# Patient Record
Sex: Male | Born: 1980 | Race: Black or African American | Hispanic: No | Marital: Single | Smoking: Never smoker
Health system: Southern US, Community
[De-identification: ages and names within clinical notes are randomized; demographics above are authoritative.]

## PROBLEM LIST (undated history)

## (undated) DIAGNOSIS — K219 Gastro-esophageal reflux disease without esophagitis: Secondary | ICD-10-CM

## (undated) HISTORY — DX: Gastro-esophageal reflux disease without esophagitis: K21.9

## (undated) HISTORY — PX: GASTRIC BYPASS: SHX52

---

## 2016-01-07 ENCOUNTER — Ambulatory Visit (HOSPITAL_COMMUNITY)
Admission: RE | Admit: 2016-01-07 | Discharge: 2016-01-07 | Disposition: A | Discharge status: Home / Self Care | Payer: No Typology Code available for payment source | Source: Ambulatory Visit | Attending: Chiropractic Medicine | Admitting: Chiropractic Medicine

## 2016-01-07 ENCOUNTER — Other Ambulatory Visit (HOSPITAL_COMMUNITY): Payer: Self-pay | Admitting: Chiropractic Medicine

## 2016-01-07 DIAGNOSIS — M542 Cervicalgia: Secondary | ICD-10-CM | POA: Insufficient documentation

## 2016-01-07 DIAGNOSIS — R52 Pain, unspecified: Secondary | ICD-10-CM

## 2016-01-07 DIAGNOSIS — M549 Dorsalgia, unspecified: Secondary | ICD-10-CM | POA: Diagnosis present
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
## 2018-03-15 ENCOUNTER — Telehealth: Payer: Self-pay

## 2018-03-15 NOTE — Telephone Encounter (Signed)
Copied from CRM [phone]. Topic: Appointment Scheduling - New Patient >> Mar 15, 2018 11:30 AM Angela NevinWilliams, Candice N wrote: New patient has been scheduled for your office. Provider: Dr. Salomon FickBanks  Date of Appointment: 01/08  Route to department's PEC pool.

## 2018-03-28 ENCOUNTER — Ambulatory Visit (INDEPENDENT_AMBULATORY_CARE_PROVIDER_SITE_OTHER): Payer: No Typology Code available for payment source | Admitting: Family Medicine

## 2018-03-28 ENCOUNTER — Encounter: Payer: Self-pay | Admitting: Family Medicine

## 2018-03-28 VITALS — BP 126/84 | HR 100 | Temp 98.5°F | Ht 72.0 in | Wt 296.0 lb

## 2018-03-28 DIAGNOSIS — I493 Ventricular premature depolarization: Secondary | ICD-10-CM

## 2018-03-28 DIAGNOSIS — R0683 Snoring: Secondary | ICD-10-CM

## 2018-03-28 DIAGNOSIS — R5383 Other fatigue: Secondary | ICD-10-CM

## 2018-03-28 DIAGNOSIS — Z7689 Persons encountering health services in other specified circumstances: Secondary | ICD-10-CM

## 2018-03-28 NOTE — Progress Notes (Signed)
Patient presents to clinic today to follow up on ongoing concern and establish care.  Pt is accompanied by a male friend.  SUBJECTIVE: PMH: Pt is a 38 yo male with pmh sig for GERD.  Pt has not been seen by a regular provider.    Pt notes recent trip to [facility] (12/26) for PVCs.  Pt had chest discomfort at work that continued to the next day prompting him to seek care.  Pt states EKG with a few PVCs and labs were normal.  Pt denies anxiety and drinking caffeine.  Pt endorses concerns about having a heart condition.  Pt's mom and dad had several MIs.     "feeling off": -pt notes feeling tired/sluggish for "a while" -also having increased heart burn -expresses concerns about overall health -trying to drink more water and exercise -has stopped eating fast food and is eating more baked items.  Snoring: -pt notes being told he snores -pt's friend endorses apneic spells at night and frequent awakening -pt notes fatigue during the day.  -denies daytime somnolence or need for naps. -has never had a sleep study.  Allergies: NKDA  Social hx: Pt is divorced and currently single.  Pt works as a Lawyer.  Pt endorses social EtOH use.  Pt denies tobacco and drug use.  Health Maintenance: [facility] -- [facility] [address] Immunizations --TB test 2019  FMHx: Mom- MI, drug abuse, alcohol abuse, heart disease Dad, desc, Alcohol abuse, drug abuse, MI x3 (died of MI at 67), heart disease MGM-throat cancer, hearing loss, MI MGF-cancer, hearing loss, MI PGM-ovaria cancer, MI PGF-prostate cancer, hearing loss, MI History reviewed. No pertinent past medical history.  History reviewed. No pertinent surgical history.  No current outpatient medications on file prior to visit.   No current facility-administered medications on file prior to visit.     No Known Allergies  Family History  Problem Relation Age of Onset  . Heart attack Mother   . Heart attack Father   . Heart  attack Maternal Grandmother   . Cancer Maternal Grandmother   . Heart attack Maternal Grandfather   . Cancer Maternal Grandfather   . Heart attack Paternal Grandmother   . Cancer Paternal Grandmother   . Heart attack Paternal Grandfather   . Cancer Paternal Grandfather     Social History   Socioeconomic History  . Marital status: Single    Spouse name: Not on file  . Number of children: Not on file  . Years of education: Not on file  . Highest education level: Not on file  Occupational History  . Not on file  Social Needs  . Financial resource strain: Not on file  . Food insecurity:    Worry: Not on file    Inability: Not on file  . Transportation needs:    Medical: Not on file    Non-medical: Not on file  Tobacco Use  . Smoking status: Never Smoker  . Smokeless tobacco: Never Used  Substance and Sexual Activity  . Alcohol use: Yes    Comment: OCCASIONAL  . Drug use: Never  . Sexual activity: Not on file  Lifestyle  . Physical activity:    Days per week: Not on file    Minutes per session: Not on file  . Stress: Not on file  Relationships  . Social connections:    Talks on phone: Not on file    Gets together: Not on file    Attends religious service: Not  on file    Active member of club or organization: Not on file    Attends meetings of clubs or organizations: Not on file    Relationship status: Not on file  . Intimate partner violence:    Fear of current or ex partner: Not on file    Emotionally abused: Not on file    Physically abused: Not on file    Forced sexual activity: Not on file  Other Topics Concern  . Not on file  Social History Narrative  . Not on file    ROS General: Denies fever, chills, night sweats, changes in weight, changes in appetite +fatigue HEENT: Denies headaches, ear pain, changes in vision, rhinorrhea, sore throat CV: Denies CP, palpitations, SOB, orthopnea  +h/o PVCs Pulm: Denies SOB, cough, wheezing  +snoring GI: Denies  abdominal pain, nausea, vomiting, diarrhea, constipation GU: Denies dysuria, hematuria, frequency, vaginal discharge Msk: Denies muscle cramps, joint pains Neuro: Denies weakness, numbness, tingling Skin: Denies rashes, bruising Psych: Denies depression, anxiety, hallucinations   BP 126/84 (BP Location: Left Arm, Patient Position: Sitting, Cuff Size: Large)   Pulse 100   Temp 98.5 F (36.9 C) (Oral)   Ht 6' (1.829 m)   Wt 296 lb (134.3 kg)   SpO2 96%   BMI 40.14 kg/m   Physical Exam Gen. Pleasant, well developed, well-nourished, in NAD HEENT - [place]/AT, PERRL, no scleral icterus, no nasal drainage, pharynx without erythema or exudate. Neck: No JVD, no thyromegaly, no carotid bruits Lungs: no use of accessory muscles, CTAB, no wheezes, rales or rhonchi Cardiovascular: RRR, No r/g/m, no peripheral edema Abdomen: BS present, soft, nontender,nondistended Neuro:  A&Ox3, CN II-XII intact, normal gait Skin:  Warm, dry, intact, no lesions  No results found for this or any previous visit (from the past 2160 hour(s)).  Assessment/Plan: Fatigue, unspecified type -discussed various causes including thyroid dysfunction, cardiac causes, Vit D def, OSA -will obtain labs when pt returns in the next few wks.  PVC's (premature ventricular contractions) -RRR on exam -discussed obtaining recrods from UC visit -discussed limiting caffeine and EtOH intake. -consider holter monitor -given handout -pt to [facility] in a few wks for CPE and labs (inculding TSH, free T4)  Snoring  -concern for OSA. -likely contributing to fatigue -will place referral for sleep study - Plan: Ambulatory referral to Pulmonology  Encounter to establish care  -We reviewed the PMH, PSH, FH, SH, Meds and Allergies. -We provided refills for any medications we will prescribe as needed. -We addressed current concerns per orders and patient instructions. -We have asked for records for pertinent exams, studies, vaccines and  notes from previous providers. -We have advised patient to follow up per instructions below.  F/u in the next few wks for CPE  Abbe Amsterdam, MD

## 2018-03-28 NOTE — Patient Instructions (Signed)
Premature Ventricular Contraction  A premature ventricular contraction (PVC) is a common kind of irregular heartbeat (arrhythmia). These contractions are extra heartbeats that start in the ventricles of the heart and occur too early in the normal sequence. During the PVC, the heart's normal electrical pathway is not used, so the beat is shorter and less effective. In most cases, these contractions come and go and do not require treatment. What are the causes? Common causes of the condition include:  Smoking.  Drinking alcohol.  Certain medicines.  Some illegal drugs.  Stress.  Caffeine. Certain medical conditions can also cause PVCs:  Heart failure.  Heart attack, or coronary artery disease.  Heart valve problems.  Changes in minerals in the blood (electrolytes).  Low blood oxygen levels or high carbon dioxide levels. In many cases, the cause of this condition is not known. What are the signs or symptoms? The main symptom of this condition is fast or skipped heartbeats (palpitations). Other symptoms include:  Chest pain.  Shortness of breath.  Feeling tired.  Dizziness.  Difficulty exercising. In some cases, there are no symptoms. How is this diagnosed? This condition may be diagnosed based on:  Your medical history.  A physical exam. During the exam, the health care provider will check for irregular heartbeats.  Tests, such as: ? An ECG (electrocardiogram) to monitor the electrical activity of your heart. ? Ambulatory cardiac monitor. This device records your heartbeats for 24 hours or more. ? Stress tests to see how exercise affects your heart rhythm and blood supply. ? Echocardiogram. This test uses sound waves (ultrasound) to produce an image of your heart. ? Electrophysiology study (EPS). This test checks for electrical problems in your heart. How is this treated? Treatment for this condition depends on any underlying conditions, the type of PVCs that you  are having, and how much the symptoms are interfering with your daily life. Possible treatments include:  Avoiding things that cause premature contractions (triggers). These include caffeine and alcohol.  Taking medicines if symptoms are severe or if the extra heartbeats are frequent.  Getting treatment for underlying conditions that cause PVCs.  Having an implantable cardioverter defibrillator (ICD), if you are at risk for a serious arrhythmia. The ICD is a small device that is inserted into your chest to monitor your heartbeat. When it senses an irregular heartbeat, it sends a shock to bring the heartbeat back to normal.  Having a procedure to destroy the portion of the heart tissue that sends out abnormal signals (catheter ablation). In some cases, no treatment is required. Follow these instructions at home: Alcohol use   Do not drink alcohol if: ? Your health care provider tells you not to drink. ? You are pregnant, may be pregnant, or are planning to become pregnant. ? Alcohol triggers your episodes.  If you drink alcohol, limit how much you have. You may drink: ? 0-1 drink a day for women. ? 0-2 drinks a day for men.  Be aware of how much alcohol is in your drink. In the [place] one drink equals one typical bottle of beer (12 oz), one-half glass of wine (5 oz), or one shot of hard liquor (1 oz). General instructions  Do not use any products that contain nicotine or tobacco, such as cigarettes and e-cigarettes. If you need help quitting, ask your health care provider.  Find healthy ways to manage stress. Avoid stressful situations when possible.  Exercise regularly. Ask your health care provider what type of exercise is safe  for you.  Try to get at least 7-9 hours of sleep each night, or as much as recommended by your health care provider.  If caffeine triggers episodes of PVC, do not eat, drink, or use anything with caffeine in it.  Do not use illegal drugs.  Take  over-the-counter and prescription medicines only as told by your health care provider.  Keep all follow-up visits as told by your health care provider. This is important. Contact a health care provider if you:  Feel palpitations. Get help right away if you:  Have chest pain.  Have shortness of breath.  Have sweating for no reason.  Have nausea and vomiting.  Become light-headed or you faint. Summary  A premature ventricular contraction (PVC) is a common kind of irregular heartbeat (arrhythmia).  In most cases, these contractions come and go and do not require treatment.  You may need to wear an ambulatory cardiac monitor. This records your heartbeats for 24 hours or more.  Treatment depends on any underlying conditions, the type of PVCs that you are having, and how much the symptoms are interfering with your daily life. This information is not intended to replace advice given to you by your health care provider. Make sure you discuss any questions you have with your health care provider. Document Released: 10/23/2003 Document Revised: 10/20/2017 Document Reviewed: 04/25/2017 Elsevier Interactive Patient Education  2019 [employer].  Fatigue If you have fatigue, you feel tired all the time and have a lack of energy or a lack of motivation. Fatigue may make it difficult to start or complete tasks because of exhaustion. In general, occasional or mild fatigue is often a normal response to activity or life. However, long-lasting (chronic) or extreme fatigue may be a symptom of a medical condition. Follow these instructions at home: General instructions  Watch your fatigue for any changes.  Go to bed and get up at the same time every day.  Avoid fatigue by pacing yourself during the day and getting enough sleep at night.  Maintain a healthy weight. Medicines  Take over-the-counter and prescription medicines only as told by your health care provider.  Take a multivitamin, if  told by your health care provider.  Do not use herbal or dietary supplements unless they are approved by your health care provider. Activity   Exercise regularly, as told by your health care provider.  Use or practice techniques to help you relax, such as yoga, tai chi, meditation, or massage therapy. Eating and drinking   Avoid heavy meals in the evening.  Eat a well-balanced diet, which includes lean proteins, whole grains, plenty of fruits and vegetables, and low-fat dairy products.  Avoid consuming too much caffeine.  Avoid the use of alcohol.  Drink enough fluid to keep your urine pale yellow. Lifestyle  Change situations that cause you stress. Try to keep your work and personal schedule in balance.  Do not use any products that contain nicotine or tobacco, such as cigarettes and e-cigarettes. If you need help quitting, ask your health care provider.  Do not use drugs. Contact a health care provider if:  Your fatigue does not get better.  You have a fever.  You suddenly lose or gain weight.  You have headaches.  You have trouble falling asleep or sleeping through the night.  You feel angry, guilty, anxious, or sad.  You are unable to have a bowel movement (constipation).  Your skin is dry.  You have swelling in your legs or another  part of your body. Get help right away if:  You feel confused.  Your vision is blurry.  You feel faint or you pass out.  You have a severe headache.  You have severe pain in your abdomen, your back, or the area between your waist and hips (pelvis).  You have chest pain, shortness of breath, or an irregular or fast heartbeat.  You are unable to urinate, or you urinate less than normal.  You have abnormal bleeding, such as bleeding from the rectum, vagina, nose, lungs, or nipples.  You vomit blood.  You have thoughts about hurting yourself or others. If you ever feel like you may hurt yourself or others, or have  thoughts about taking your own life, get help right away. You can go to your nearest emergency department or call:  Your local emergency services (911 in the [place]  A suicide crisis helpline, such as the [place] at 719-501-0557. This is open 24 hours a day. Summary  If you have fatigue, you feel tired all the time and have a lack of energy or a lack of motivation.  Fatigue may make it difficult to start or complete tasks because of exhaustion.  Long-lasting (chronic) or extreme fatigue may be a symptom of a medical condition.  Exercise regularly, as told by your health care provider.  Change situations that cause you stress. Try to keep your work and personal schedule in balance. This information is not intended to replace advice given to you by your health care provider. Make sure you discuss any questions you have with your health care provider. Document Released: 01/02/2007 Document Revised: 11/30/2016 Document Reviewed: 11/30/2016 Elsevier Interactive Patient Education  [employer].

## 2018-03-29 ENCOUNTER — Encounter: Payer: Self-pay | Admitting: Family Medicine

## 2018-04-11 ENCOUNTER — Ambulatory Visit (INDEPENDENT_AMBULATORY_CARE_PROVIDER_SITE_OTHER): Payer: No Typology Code available for payment source | Admitting: Family Medicine

## 2018-04-11 ENCOUNTER — Encounter: Payer: No Typology Code available for payment source | Admitting: Family Medicine

## 2018-04-11 ENCOUNTER — Encounter: Payer: Self-pay | Admitting: Family Medicine

## 2018-04-11 VITALS — BP 120/80 | HR 86 | Temp 98.0°F | Wt 293.0 lb

## 2018-04-11 DIAGNOSIS — Z0000 Encounter for general adult medical examination without abnormal findings: Secondary | ICD-10-CM | POA: Diagnosis not present

## 2018-04-11 DIAGNOSIS — Z131 Encounter for screening for diabetes mellitus: Secondary | ICD-10-CM

## 2018-04-11 DIAGNOSIS — R5383 Other fatigue: Secondary | ICD-10-CM | POA: Diagnosis not present

## 2018-04-11 DIAGNOSIS — Z13220 Encounter for screening for lipoid disorders: Secondary | ICD-10-CM | POA: Diagnosis not present

## 2018-04-11 DIAGNOSIS — R0982 Postnasal drip: Secondary | ICD-10-CM

## 2018-04-11 DIAGNOSIS — I493 Ventricular premature depolarization: Secondary | ICD-10-CM | POA: Diagnosis not present

## 2018-04-11 LAB — CBC WITH DIFFERENTIAL/PLATELET
Basophils Absolute: 0 10*3/uL (ref 0.0–0.1)
Basophils Relative: 0.3 % (ref 0.0–3.0)
EOS ABS: 0.1 10*3/uL (ref 0.0–0.7)
EOS PCT: 1.2 % (ref 0.0–5.0)
HCT: 44.5 % (ref 39.0–52.0)
Hemoglobin: 14.8 g/dL (ref 13.0–17.0)
LYMPHS ABS: 2.7 10*3/uL (ref 0.7–4.0)
Lymphocytes Relative: 29.4 % (ref 12.0–46.0)
MCHC: 33.3 g/dL (ref 30.0–36.0)
MCV: 76.9 fl — ABNORMAL LOW (ref 78.0–100.0)
MONO ABS: 0.5 10*3/uL (ref 0.1–1.0)
Monocytes Relative: 5.3 % (ref 3.0–12.0)
Neutro Abs: 5.8 10*3/uL (ref 1.4–7.7)
Neutrophils Relative %: 63.8 % (ref 43.0–77.0)
Platelets: 331 10*3/uL (ref 150.0–400.0)
RBC: 5.79 Mil/uL (ref 4.22–5.81)
RDW: 14 % (ref 11.5–15.5)
WBC: 9.1 10*3/uL (ref 4.0–10.5)

## 2018-04-11 LAB — TSH: TSH: 2.25 u[IU]/mL (ref 0.35–4.50)

## 2018-04-11 LAB — T4, FREE: Free T4: 0.77 ng/dL (ref 0.60–1.60)

## 2018-04-11 LAB — BASIC METABOLIC PANEL
BUN: 12 mg/dL (ref 6–23)
CO2: 28 mEq/L (ref 19–32)
Calcium: 10.2 mg/dL (ref 8.4–10.5)
Chloride: 102 mEq/L (ref 96–112)
Creatinine, Ser: 0.9 mg/dL (ref 0.40–1.50)
GFR: 114.94 mL/min (ref >60.00)
Glucose, Bld: 77 mg/dL (ref 70–99)
Potassium: 4.2 mEq/L (ref 3.5–5.1)
Sodium: 140 mEq/L (ref 135–145)

## 2018-04-11 LAB — LIPID PANEL
CHOLESTEROL: 211 mg/dL — AB (ref 0–200)
HDL: 34 mg/dL — ABNORMAL LOW (ref >39.00)
LDL Cholesterol: 143 mg/dL — ABNORMAL HIGH (ref 0–99)
NonHDL: 177.4
Total CHOL/HDL Ratio: 6
Triglycerides: 171 mg/dL — ABNORMAL HIGH (ref 0.0–149.0)
VLDL: 34.2 mg/dL (ref 0.0–40.0)

## 2018-04-11 LAB — HEMOGLOBIN A1C: Hgb A1c MFr Bld: 5.7 % (ref 4.6–6.5)

## 2018-04-11 LAB — VITAMIN D 25 HYDROXY (VIT D DEFICIENCY, FRACTURES): VITD: 14.96 ng/mL — ABNORMAL LOW (ref 30.00–100.00)

## 2018-04-11 MED ORDER — CETIRIZINE HCL 10 MG PO TABS: 10.0000 mg | ORAL_TABLET | Freq: Every day | ORAL | 2 refills | Status: DC

## 2018-04-11 NOTE — Patient Instructions (Signed)
Preventive Care 18-39 Years, Male Preventive care refers to lifestyle choices and visits with your health care provider that can promote health and wellness. What does preventive care include?   A yearly physical exam. This is also called an annual well check.  Dental exams once or twice a year.  Routine eye exams. Ask your health care provider how often you should have your eyes checked.  Personal lifestyle choices, including: ? Daily care of your teeth and gums. ? Regular physical activity. ? Eating a healthy diet. ? Avoiding tobacco and drug use. ? Limiting alcohol use. ? Practicing safe sex. What happens during an annual well check? The services and screenings done by your health care provider during your annual well check will depend on your age, overall health, lifestyle risk factors, and family history of disease. Counseling Your health care provider may ask you questions about your:  Alcohol use.  Tobacco use.  Drug use.  Emotional well-being.  Home and relationship well-being.  Sexual activity.  Eating habits.  Work and work Statistician. Screening You may have the following tests or measurements:  Height, weight, and BMI.  Blood pressure.  Lipid and cholesterol levels. These may be checked every 5 years starting at age 44.  Diabetes screening. This is done by checking your blood sugar (glucose) after you have not eaten for a while (fasting).  Skin check.  Hepatitis C blood test.  Hepatitis B blood test.  Sexually transmitted disease (STD) testing. Discuss your test results, treatment options, and if necessary, the need for more tests with your health care provider. Vaccines Your health care provider may recommend certain vaccines, such as:  Influenza vaccine. This is recommended every year.  Tetanus, diphtheria, and acellular pertussis (Tdap, Td) vaccine. You may need a Td booster every 10 years.  Varicella vaccine. You may need this if you  have not been vaccinated.  HPV vaccine. If you are 1 or younger, you may need three doses over 6 months.  Measles, mumps, and rubella (MMR) vaccine. You may need at least one dose of MMR.You may also need a second dose.  Pneumococcal 13-valent conjugate (PCV13) vaccine. You may need this if you have certain conditions and have not been vaccinated.  Pneumococcal polysaccharide (PPSV23) vaccine. You may need one or two doses if you smoke cigarettes or if you have certain conditions.  Meningococcal vaccine. One dose is recommended if you are age 39-21 years and a first-year college student living in a residence hall, or if you have one of several medical conditions. You may also need additional booster doses.  Hepatitis A vaccine. You may need this if you have certain conditions or if you travel or work in places where you may be exposed to hepatitis A.  Hepatitis B vaccine. You may need this if you have certain conditions or if you travel or work in places where you may be exposed to hepatitis B.  Haemophilus influenzae type b (Hib) vaccine. You may need this if you have certain risk factors. Talk to your health care provider about which screenings and vaccines you need and how often you need them. This information is not intended to replace advice given to you by your health care provider. Make sure you discuss any questions you have with your health care provider. Document Released: 05/03/2001 Document Revised: 10/18/2016 Document Reviewed: 01/06/2015 Elsevier Interactive Patient Education  2019 [employer].  Fatigue If you have fatigue, you feel tired all the time and have a lack of  energy or a lack of motivation. Fatigue may make it difficult to start or complete tasks because of exhaustion. In general, occasional or mild fatigue is often a normal response to activity or life. However, long-lasting (chronic) or extreme fatigue may be a symptom of a medical condition. Follow these  instructions at home: General instructions  Watch your fatigue for any changes.  Go to bed and get up at the same time every day.  Avoid fatigue by pacing yourself during the day and getting enough sleep at night.  Maintain a healthy weight. Medicines  Take over-the-counter and prescription medicines only as told by your health care provider.  Take a multivitamin, if told by your health care provider.  Do not use herbal or dietary supplements unless they are approved by your health care provider. Activity   Exercise regularly, as told by your health care provider.  Use or practice techniques to help you relax, such as yoga, tai chi, meditation, or massage therapy. Eating and drinking   Avoid heavy meals in the evening.  Eat a well-balanced diet, which includes lean proteins, whole grains, plenty of fruits and vegetables, and low-fat dairy products.  Avoid consuming too much caffeine.  Avoid the use of alcohol.  Drink enough fluid to keep your urine pale yellow. Lifestyle  Change situations that cause you stress. Try to keep your work and personal schedule in balance.  Do not use any products that contain nicotine or tobacco, such as cigarettes and e-cigarettes. If you need help quitting, ask your health care provider.  Do not use drugs. Contact a health care provider if:  Your fatigue does not get better.  You have a fever.  You suddenly lose or gain weight.  You have headaches.  You have trouble falling asleep or sleeping through the night.  You feel angry, guilty, anxious, or sad.  You are unable to have a bowel movement (constipation).  Your skin is dry.  You have swelling in your legs or another part of your body. Get help right away if:  You feel confused.  Your vision is blurry.  You feel faint or you pass out.  You have a severe headache.  You have severe pain in your abdomen, your back, or the area between your waist and hips  (pelvis).  You have chest pain, shortness of breath, or an irregular or fast heartbeat.  You are unable to urinate, or you urinate less than normal.  You have abnormal bleeding, such as bleeding from the rectum, vagina, nose, lungs, or nipples.  You vomit blood.  You have thoughts about hurting yourself or others. If you ever feel like you may hurt yourself or others, or have thoughts about taking your own life, get help right away. You can go to your nearest emergency department or call:  Your local emergency services (911 in the [place]  A suicide crisis helpline, such as the [facility] at (253) 025-1869. This is open 24 hours a day. Summary  If you have fatigue, you feel tired all the time and have a lack of energy or a lack of motivation.  Fatigue may make it difficult to start or complete tasks because of exhaustion.  Long-lasting (chronic) or extreme fatigue may be a symptom of a medical condition.  Exercise regularly, as told by your health care provider.  Change situations that cause you stress. Try to keep your work and personal schedule in balance. This information is not intended to replace advice given  to you by your health care provider. Make sure you discuss any questions you have with your health care provider. Document Released: 01/02/2007 Document Revised: 11/30/2016 Document Reviewed: 11/30/2016 Elsevier Interactive Patient Education  [employer].

## 2018-04-11 NOTE — Progress Notes (Signed)
Subjective:     Dillon Rueb. is a 38 y.o. male and is here for a comprehensive physical exam. The patient reports problems - sinus issues/drainage.  Pt has not tried anything for his symptoms.  In the past he was given nasal spray pills.  Pt fasting for labs.  Also getting labs for fatigue and PVCs as discussed at last OFV on 03/28/18.  Pt trying to drink more water daily.  Pt has yet to hear back about the sleep study.  Referral was placed at last OFV.  Social History   Socioeconomic History  . Marital status: Single    Spouse name: Not on file  . Number of children: Not on file  . Years of education: Not on file  . Highest education level: Not on file  Occupational History  . Not on file  Social Needs  . Financial resource strain: Not on file  . Food insecurity:    Worry: Not on file    Inability: Not on file  . Transportation needs:    Medical: Not on file    Non-medical: Not on file  Tobacco Use  . Smoking status: Never Smoker  . Smokeless tobacco: Never Used  Substance and Sexual Activity  . Alcohol use: Yes    Comment: OCCASIONAL  . Drug use: Never  . Sexual activity: Yes  Lifestyle  . Physical activity:    Days per week: Not on file    Minutes per session: Not on file  . Stress: Not on file  Relationships  . Social connections:    Talks on phone: Not on file    Gets together: Not on file    Attends religious service: Not on file    Active member of club or organization: Not on file    Attends meetings of clubs or organizations: Not on file    Relationship status: Not on file  . Intimate partner violence:    Fear of current or ex partner: Not on file    Emotionally abused: Not on file    Physically abused: Not on file    Forced sexual activity: Not on file  Other Topics Concern  . Not on file  Social History Narrative  . Not on file   Health Maintenance  Topic Date Due  . HIV Screening  05/11/1996  . TETANUS/TDAP  05/11/2000  . INFLUENZA VACCINE   12/20/2018 (Originally 10/19/2017)    The following portions of the patient's history were reviewed and updated as appropriate: allergies, current medications, past family history, past medical history, past social history, past surgical history and problem list.  Review of Systems Pertinent items noted in HPI and remainder of comprehensive ROS otherwise negative.   Objective:    BP 120/80 (BP Location: Left Arm, Patient Position: Sitting, Cuff Size: Large)   Pulse 86   Temp 98 F (36.7 C) (Oral)   Wt 293 lb (132.9 kg)   SpO2 98%   BMI 39.74 kg/m  General appearance: alert, cooperative and no distress Head: Normocephalic, without obvious abnormality, atraumatic Eyes: conjunctivae/corneas clear. PERRL, EOM's intact. Fundi benign. Ears: TM's full b/l and external ear canals both ears Nose: Nares normal. Septum midline. Mucosa normal. No drainage or sinus tenderness. Throat: lips, mucosa, and tongue normal; teeth and gums normal, pharynx with postnasal drainage Neck: no adenopathy, no carotid bruit, no JVD, supple, symmetrical, trachea midline and thyroid not enlarged, symmetric, no tenderness/mass/nodules Lungs: clear to auscultation bilaterally Heart: regular rate and rhythm, S1, S2 normal,  no murmur, click, rub or gallop Abdomen: soft, non-tender; bowel sounds normal; no masses,  no organomegaly Extremities: extremities normal, atraumatic, no cyanosis or edema Skin: Skin color, texture, turgor normal. No rashes or lesions Neurologic: Alert and oriented X 3, normal strength and tone. Normal symmetric reflexes. Normal coordination and gait    Assessment:    Healthy male exam.      Plan:     Anticipatory guidance given including wearing seatbelts, smoke detectors in the home, increasing physical activity, increasing p.o. intake of water and vegetables. -will obtain lab as discussed during previous OFV 03/28/18 -handout given -next CPE in 1 yr See After Visit Summary for Counseling  Recommendations    Fatigue -TSH, free T4, Vitamin D, CBC -given handout  PVCs -TSH, Free T4, CBC  Post nasal drip -discussed restarting allergy nasal spray -Cetirizine 10 mg daily.  F/u prn  Dillon AmsterdamShannon Genene Kilman, MD

## 2018-04-18 ENCOUNTER — Other Ambulatory Visit: Payer: Self-pay | Admitting: Family Medicine

## 2018-04-18 MED ORDER — VITAMIN D (ERGOCALCIFEROL) 1.25 MG (50000 UNIT) PO CAPS: 50000.0000 [IU] | ORAL_CAPSULE | ORAL | 0 refills | Status: DC

## 2018-05-23 ENCOUNTER — Ambulatory Visit (INDEPENDENT_AMBULATORY_CARE_PROVIDER_SITE_OTHER): Payer: No Typology Code available for payment source | Admitting: Pulmonary Disease

## 2018-05-23 ENCOUNTER — Encounter: Payer: Self-pay | Admitting: Pulmonary Disease

## 2018-05-23 VITALS — BP 130/86 | HR 103 | Ht 72.0 in | Wt 300.0 lb

## 2018-05-23 DIAGNOSIS — R400 Somnolence: Secondary | ICD-10-CM

## 2018-05-23 NOTE — Progress Notes (Signed)
Dillon Ramirez    [id number]    1980-07-28  Primary Care Physician:Banks, Bettey Mare, MD  Referring Physician: Deeann Saint, MD [address] [place], [place] [address]  Chief complaint:   Patient with a history of snoring, daytime sleepiness  HPI:  History of snoring, daytime sleepiness About 40 pound weight gain recently Does experience shortness of breath and gasping respirations at night  Usually goes to bed about 10 PM takes him about 30 minutes to fall asleep Wakes up a few times during the night up to 5 times sometimes Final wake up time about 5:30 AM  Occasional dryness of his mouth in the mornings Occasional headaches in the mornings His memory is fine  No family history of obstructive sleep apnea  No shortness of breath during the day No significant chronic health problems No history of heart disease  Outpatient Encounter Medications as of 05/23/2018  Medication Sig  . cetirizine (ZYRTEC) 10 MG tablet Take 1 tablet (10 mg total) by mouth daily.  . Vitamin D, Ergocalciferol, (DRISDOL) 1.25 MG (50000 UT) CAPS capsule Take 1 capsule (50,000 Units total) by mouth every 7 (seven) days.   No facility-administered encounter medications on file as of 05/23/2018.     Allergies as of 05/23/2018  . (No Known Allergies)    Past Medical History:  Diagnosis Date  . GERD (gastroesophageal reflux disease)     No past surgical history on file.  Family History  Problem Relation Age of Onset  . Heart attack Mother   . Heart attack Father   . Heart attack Maternal Grandmother   . Cancer Maternal Grandmother   . Heart attack Maternal Grandfather   . Cancer Maternal Grandfather   . Heart attack Paternal Grandmother   . Cancer Paternal Grandmother   . Heart attack Paternal Grandfather   . Cancer Paternal Grandfather     Social History   Socioeconomic History  . Marital status: Single    Spouse name: Not on file  . Number of children: Not on  file  . Years of education: Not on file  . Highest education level: Not on file  Occupational History  . Not on file  Social Needs  . Financial resource strain: Not on file  . Food insecurity:    Worry: Not on file    Inability: Not on file  . Transportation needs:    Medical: Not on file    Non-medical: Not on file  Tobacco Use  . Smoking status: Never Smoker  . Smokeless tobacco: Never Used  Substance and Sexual Activity  . Alcohol use: Yes    Comment: OCCASIONAL  . Drug use: Never  . Sexual activity: Yes  Lifestyle  . Physical activity:    Days per week: Not on file    Minutes per session: Not on file  . Stress: Not on file  Relationships  . Social connections:    Talks on phone: Not on file    Gets together: Not on file    Attends religious service: Not on file    Active member of club or organization: Not on file    Attends meetings of clubs or organizations: Not on file    Relationship status: Not on file  . Intimate partner violence:    Fear of current or ex partner: Not on file    Emotionally abused: Not on file    Physically abused: Not on file  Forced sexual activity: Not on file  Other Topics Concern  . Not on file  Social History Narrative  . Not on file    Review of Systems  Constitutional: Negative for fatigue.  HENT: Negative.   Respiratory: Positive for apnea.   Cardiovascular: Negative.   Gastrointestinal: Negative.   Psychiatric/Behavioral: Positive for sleep disturbance.  All other systems reviewed and are negative.   Vitals:   05/23/18 1433  BP: 130/86  Pulse: (!) 103  SpO2: 95%     Physical Exam  Constitutional: He appears well-developed and well-nourished. No distress.  HENT:  Head: Normocephalic and atraumatic.  Crowded oropharynx, Mallampati 3  Eyes: Pupils are equal, round, and reactive to light. Conjunctivae and EOM are normal. Right eye exhibits no discharge. Left eye exhibits no discharge.  Neck: Normal range of  motion. Neck supple. No tracheal deviation present. No thyromegaly present.  Cardiovascular: Normal rate and regular rhythm.  Pulmonary/Chest: Effort normal and breath sounds normal. No respiratory distress. He has no wheezes. He has no rales. He exhibits no tenderness.  Abdominal: Soft. Bowel sounds are normal. He exhibits no distension. There is no abdominal tenderness.  Skin: He is not diaphoretic.   Results of the Epworth flowsheet 05/23/2018  Sitting and reading 2  Watching TV 2  Sitting, inactive in a public place (e.g. a theatre or a meeting) 0  As a passenger in a car for an hour without a break 2  Lying down to rest in the afternoon when circumstances permit 3  Sitting and talking to someone 0  Sitting quietly after a lunch without alcohol 3  In a car, while stopped for a few minutes in traffic 0  Total score 12   Assessment:  High probability of significant obstructive sleep apnea  Excessive daytime sleepiness  Morbid obesity  Plan/Recommendations: Pathophysiology of sleep disordered breathing discussed with the patient  Treatment options of sleep disordered breathing discussed with the patient  We will schedule the patient for a home sleep study  Importance of weight loss and exercise discussed with the patient  I will see the patient back in the office in about 3 months   Virl Diamond MD [facility] 05/23/2018, 2:55 PM  CC: Deeann Saint, MD

## 2018-05-23 NOTE — Patient Instructions (Signed)
Moderate probability of significant sleep disordered breathing  I will schedule you for home sleep study, We will give you call as soon as results are available Start treatment as indicated  I will see you back in the office in about 3 months  Call with significant concerns   Sleep Apnea Sleep apnea is a condition in which breathing pauses or becomes shallow during sleep. Episodes of sleep apnea usually last 10 seconds or longer, and they may occur as many as 20 times an hour. Sleep apnea disrupts your sleep and keeps your body from getting the rest that it needs. This condition can increase your risk of certain health problems, including:  Heart attack.  Stroke.  Obesity.  Diabetes.  Heart failure.  Irregular heartbeat. There are three kinds of sleep apnea:  Obstructive sleep apnea. This kind is caused by a blocked or collapsed airway.  Central sleep apnea. This kind happens when the part of the brain that controls breathing does not send the correct signals to the muscles that control breathing.  Mixed sleep apnea. This is a combination of obstructive and central sleep apnea. What are the causes? The most common cause of this condition is a collapsed or blocked airway. An airway can collapse or become blocked if:  Your throat muscles are abnormally relaxed.  Your tongue and tonsils are larger than normal.  You are overweight.  Your airway is smaller than normal. What increases the risk? This condition is more likely to develop in people who:  Are overweight.  Smoke.  Have a smaller than normal airway.  Are elderly.  Are male.  Drink alcohol.  Take sedatives or tranquilizers.  Have a family history of sleep apnea. What are the signs or symptoms? Symptoms of this condition include:  Trouble staying asleep.  Daytime sleepiness and tiredness.  Irritability.  Loud snoring.  Morning headaches.  Trouble concentrating.  Forgetfulness.  Decreased  interest in sex.  Unexplained sleepiness.  Mood swings.  Personality changes.  Feelings of depression.  Waking up often during the night to urinate.  Dry mouth.  Sore throat. How is this diagnosed? This condition may be diagnosed with:  A medical history.  A physical exam.  A series of tests that are done while you are sleeping (sleep study). These tests are usually done in a sleep lab, but they may also be done at home. How is this treated? Treatment for this condition aims to restore normal breathing and to ease symptoms during sleep. It may involve managing health issues that can affect breathing, such as high blood pressure or obesity. Treatment may include:  Sleeping on your side.  Using a decongestant if you have nasal congestion.  Avoiding the use of depressants, including alcohol, sedatives, and narcotics.  Losing weight if you are overweight.  Making changes to your diet.  Quitting smoking.  Using a device to open your airway while you sleep, such as: ? An oral appliance. This is a custom-made mouthpiece that shifts your lower jaw forward. ? A continuous positive airway pressure (CPAP) device. This device delivers oxygen to your airway through a mask. ? A nasal expiratory positive airway pressure (EPAP) device. This device has valves that you put into each nostril. ? A bi-level positive airway pressure (BPAP) device. This device delivers oxygen to your airway through a mask.  Surgery if other treatments do not work. During surgery, excess tissue is removed to create a wider airway. It is important to get treatment for sleep apnea.  Without treatment, this condition can lead to:  High blood pressure.  Coronary artery disease.  (Men) An inability to achieve or maintain an erection (impotence).  Reduced thinking abilities. Follow these instructions at home:  Make any lifestyle changes that your health care provider recommends.  Eat a healthy,  well-balanced diet.  Take over-the-counter and prescription medicines only as told by your health care provider.  Avoid using depressants, including alcohol, sedatives, and narcotics.  Take steps to lose weight if you are overweight.  If you were given a device to open your airway while you sleep, use it only as told by your health care provider.  Do not use any tobacco products, such as cigarettes, chewing tobacco, and e-cigarettes. If you need help quitting, ask your health care provider.  Keep all follow-up visits as told by your health care provider. This is important. Contact a health care provider if:  The device that you received to open your airway during sleep is uncomfortable or does not seem to be working.  Your symptoms do not improve.  Your symptoms get worse. Get help right away if:  You develop chest pain.  You develop shortness of breath.  You develop discomfort in your back, arms, or stomach.  You have trouble speaking.  You have weakness on one side of your body.  You have drooping in your face. These symptoms may represent a serious problem that is an emergency. Do not wait to see if the symptoms will go away. Get medical help right away. Call your local emergency services (911 in the [place] Do not drive yourself to the hospital. This information is not intended to replace advice given to you by your health care provider. Make sure you discuss any questions you have with your health care provider. Document Released: 02/25/2002 Document Revised: 10/03/2016 Document Reviewed: 12/15/2014 Elsevier Interactive Patient Education  2019 [employer].

## 2018-05-30 ENCOUNTER — Other Ambulatory Visit: Payer: Self-pay | Admitting: Pulmonary Disease

## 2018-05-30 DIAGNOSIS — G4733 Obstructive sleep apnea (adult) (pediatric): Secondary | ICD-10-CM

## 2018-05-31 ENCOUNTER — Other Ambulatory Visit: Payer: Self-pay | Admitting: Pulmonary Disease

## 2018-07-02 ENCOUNTER — Telehealth: Payer: Self-pay | Admitting: Pulmonary Disease

## 2018-07-02 DIAGNOSIS — G4733 Obstructive sleep apnea (adult) (pediatric): Secondary | ICD-10-CM

## 2018-07-02 NOTE — Telephone Encounter (Signed)
No elective procedures being done at the hospital at this time, we could schedule this after the Covid-19 is gone. At this time we do not know when they will resume. PCC's do we just put an order in so you can defer it? Please advise.  DOD is it ok to schedule a sleep study for the hospital for Dr. Val Eagle?

## 2018-07-02 NOTE — Telephone Encounter (Signed)
I cancelled appt for June per provider - pt states that he should be going into the hospital for a sleep study - he doesn't want to set up another appt for 3 month rov as appt notes state.  pt's AVS states that Dr. Val Eagle would be setting up a home sleep study- pt says he will have to go into hospital for the study due to insurance not covering a home study. Pt states that he had a message from [facility].  Please advise. CB# [phone]

## 2018-07-02 NOTE — Telephone Encounter (Signed)
It was my understanding that home sleep studies as well as sleep studies at the sleep center are not being performed at this time per COVID-19 restrictions.  We will wait to hear back from patient care coordinators for their input.  Elisha Headland, FNP

## 2018-07-03 NOTE — Telephone Encounter (Signed)
Sleep center has stopped npsg's for now and we are not doing any home studies either we do have a ongoing list once the decision to start back up has been made Tobe Sos

## 2018-07-03 NOTE — Telephone Encounter (Signed)
That's fine. Place order for split night study.   Arlys John

## 2018-07-03 NOTE — Telephone Encounter (Signed)
Order has been placed for split night study. Nothing further needed. 

## 2018-07-03 NOTE — Telephone Encounter (Signed)
I can place order for sleep study and place in comments to call and schedule patient once Covid-19 restrictions are lifted.   BM please advise if okay to place order for in-lab sleep study as insurance will not cover home sleep study.

## 2018-07-12 ENCOUNTER — Ambulatory Visit: Payer: Self-pay

## 2018-07-12 ENCOUNTER — Ambulatory Visit (INDEPENDENT_AMBULATORY_CARE_PROVIDER_SITE_OTHER): Payer: No Typology Code available for payment source | Admitting: Family Medicine

## 2018-07-12 ENCOUNTER — Encounter: Payer: Self-pay | Admitting: Family Medicine

## 2018-07-12 NOTE — Telephone Encounter (Signed)
Pt. Called to report he tested positive for COVID 19 on 06/28/18.  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  Reported he has remained quarantined for 14 days, and now is required to get permission from his PCP to return to work. Stated he has had an "occasional" cough over the 2 week period.  Coughs up small amt. Clear mucus at intervals.  Denied shortness of breath, wheezing, or fever.  Has checked his temperature daily.  C/o headache x 2 in past 14 days.  Denied any chest pain or tightness.  Stated he feels about the same.  Was advised to get permission from PCP to return to work.    Transferred to Scheduler for an appt. With PCP.  Pt. Agreed with plan.   Reason for Disposition . 1] COVID-19 infection diagnosed or suspected AND [2] mild symptoms (fever, cough) AND [2] no trouble breathing or other complications    Tested positive for COVID 19 on 06/28/18; only symptoms was occas. Cough with clear mucus and infreq. Headache; denied fever or shortness of breath  Answer Assessment - Initial Assessment Questions 1. COVID-19 DIAGNOSIS: "Who made your Coronavirus (COVID-19) diagnosis?" "Was it confirmed by a positive lab test?" If not diagnosed by a HCP, ask "Are there lots of cases (community spread) where you live?" (See public health department website, if unsure)   * MAJOR community spread: high number of cases; numbers of cases are increasing; many people hospitalized.   * MINOR community spread: low number of cases; not increasing; few or no people hospitalized     Community prevalence 2. ONSET: "When did the COVID-19 symptoms start?"      No symptoms; tested due to working in nursing home  3. WORST SYMPTOM: "What is your worst symptom?" (e.g., cough, fever, shortness of breath, muscle aches)     intermittent cough 4. COUGH: "How bad is the cough?"      Occasional cough with intermittent clear mucus  5. FEVER: "Do you have a fever?" If so, ask:  "What is your temperature, how was it measured, and when did it start?"     No fever past 14 days;  6. RESPIRATORY STATUS: "Describe your breathing?" (e.g., shortness of breath, wheezing, unable to speak)     Denied shortness of breath; denied wheezing; 7. BETTER-SAME-WORSE: "Are you getting better, staying the same or getting worse compared to yesterday?"  If getting worse, ask, "In what way?"     Stated feels the same 8. HIGH RISK DISEASE: "Do you have any chronic medical problems?" (e.g., asthma, heart or lung disease, weak immune system, etc.)     Hx of asthma as a child; denied any other medical problems 9. PREGNANCY: "Is there any chance you are pregnant?" "When was your last menstrual period?"     N/a 10. OTHER SYMPTOMS: "Do you have any other symptoms?"  (e.g., runny nose, headache, sore throat, loss of smell)        denied chest pain/ tightness; headache x 2 in past 14 days  Protocols used: CORONAVIRUS (COVID-19) DIAGNOSED OR SUSPECTED-A-AH

## 2018-07-12 NOTE — Telephone Encounter (Signed)
Pt is scheduled for a webex appointment today at 3 pm

## 2018-07-12 NOTE — Progress Notes (Signed)
Virtual Visit via Video Note  I connected with Dillon Ramirez. on 07/12/18 at  3:00 PM EDT by a video enabled telemedicine application and verified that I am speaking with the correct person using two identifiers.  Location patient: home Location provider:work or home office Persons participating in the virtual visit: patient, provider  I discussed the limitations of evaluation and management by telemedicine and the availability of in person appointments. The patient expressed understanding and agreed to proceed.   HPI: Tested positive for COVID-19 at work on 4/9.  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  Testing was done by the HD.  Pt states he completed 14 d quarantine.  Pt has been in contact with his [employer] and workman's comp while quarantined.  Workman's comp told pt to get retested twice in 24hrs with 2 negative tests or have an e-visit before he could return to work.  Pt states he never had symptoms.  States feels ok.  Denies fever, cough, HA, chills, n/v, diarrhea, ST, rhinorrhea, aches.  Pt states he never received a hard copy of his test results.  ROS: See pertinent positives and negatives per HPI.  Past Medical History:  Diagnosis Date  . GERD (gastroesophageal reflux disease)     No past surgical history on file.  Family History  Problem Relation Age of Onset  . Heart attack Mother   . Heart attack Father   . Heart attack Maternal Grandmother   . Cancer Maternal Grandmother   . Heart attack Maternal Grandfather   . Cancer Maternal Grandfather   . Heart attack Paternal Grandmother   . Cancer Paternal Grandmother   . Heart attack Paternal Grandfather   . Cancer Paternal Grandfather     SOCIAL HX: Pt lives alone.   Current Outpatient Medications:  .  cetirizine (ZYRTEC) 10 MG tablet, Take 1 tablet (10 mg total) by mouth daily., Disp: 30 tablet, Rfl: 2 .  Vitamin D, Ergocalciferol, (DRISDOL) 1.25 MG (50000 UT) CAPS capsule,  Take 1 capsule (50,000 Units total) by mouth every 7 (seven) days., Disp: 12 capsule, Rfl: 0  EXAM:  VITALS per patient if applicable:  RR between 12-20 bpm.  GENERAL: alert, oriented, appears well and in no acute distress  HEENT: atraumatic, conjunctiva clear, no obvious abnormalities on inspection of external nose and ears  NECK: normal movements of the head and neck  LUNGS: on inspection no signs of respiratory distress, breathing rate appears normal, no obvious gross SOB, gasping or wheezing  CV: no obvious cyanosis  MS: moves all visible extremities without noticeable abnormality  PSYCH/NEURO: pleasant and cooperative, no obvious depression or anxiety, speech and thought processing grossly intact  ASSESSMENT AND PLAN:  Discussed the following assessment and plan:  COVID-19 virus infection  -asymptomatic -positive test result per pt.   Discussed trying to obtain test results from outside HD. -pt completed 14 day quarantine. -ongoing workman's comp case-requesting 2 negative test done in 24 hours or e-visit.  Pt advised limited testing available. -will provide limited note stating pt was seen.  F/u prn   I discussed the assessment and treatment plan with the patient. The patient was provided an opportunity to ask questions and all were answered. The patient agreed with the plan and demonstrated an understanding of the instructions.   The patient was advised to call back or seek an in-person evaluation if the symptoms worsen or if the condition fails to improve as anticipated.  Carollee Herter  Merlene Laughter, MD

## 2018-07-13 ENCOUNTER — Telehealth: Payer: Self-pay | Admitting: *Deleted

## 2018-07-13 NOTE — Telephone Encounter (Signed)
Clinic RN called patient to inform him that his return to work letter is ready. Informed patient we can mail letter to his address or fax letter to his employer. Patient would like letter faxed to employer. Patient will call back with fax number

## 2018-07-16 NOTE — Telephone Encounter (Signed)
Patient returning call with fax number.    Fax# [phone] attn Tresa Endo

## 2018-07-17 NOTE — Telephone Encounter (Signed)
Pt work note faxed as requested, confirmation received

## 2018-07-23 ENCOUNTER — Encounter (HOSPITAL_BASED_OUTPATIENT_CLINIC_OR_DEPARTMENT_OTHER): Payer: No Typology Code available for payment source

## 2018-08-23 ENCOUNTER — Ambulatory Visit: Payer: No Typology Code available for payment source | Admitting: Pulmonary Disease

## 2018-08-28 ENCOUNTER — Encounter (HOSPITAL_BASED_OUTPATIENT_CLINIC_OR_DEPARTMENT_OTHER): Payer: Self-pay | Admitting: *Deleted

## 2018-08-31 ENCOUNTER — Other Ambulatory Visit (HOSPITAL_COMMUNITY): Payer: No Typology Code available for payment source

## 2018-09-01 ENCOUNTER — Other Ambulatory Visit (HOSPITAL_COMMUNITY)
Admission: RE | Admit: 2018-09-01 | Discharge: 2018-09-01 | Disposition: A | Discharge status: Home / Self Care | Payer: No Typology Code available for payment source | Source: Ambulatory Visit | Attending: Pulmonary Disease | Admitting: Pulmonary Disease

## 2018-09-01 DIAGNOSIS — Z1159 Encounter for screening for other viral diseases: Secondary | ICD-10-CM | POA: Insufficient documentation

## 2018-09-02 ENCOUNTER — Encounter (HOSPITAL_BASED_OUTPATIENT_CLINIC_OR_DEPARTMENT_OTHER): Payer: No Typology Code available for payment source

## 2018-09-03 ENCOUNTER — Other Ambulatory Visit: Payer: Self-pay

## 2018-09-03 ENCOUNTER — Ambulatory Visit (HOSPITAL_BASED_OUTPATIENT_CLINIC_OR_DEPARTMENT_OTHER): Payer: No Typology Code available for payment source | Attending: Pulmonary Disease | Admitting: Pulmonary Disease

## 2018-09-03 DIAGNOSIS — G4733 Obstructive sleep apnea (adult) (pediatric): Secondary | ICD-10-CM | POA: Insufficient documentation

## 2018-09-03 DIAGNOSIS — R51 Headache: Secondary | ICD-10-CM | POA: Diagnosis not present

## 2018-09-03 DIAGNOSIS — E669 Obesity, unspecified: Secondary | ICD-10-CM | POA: Insufficient documentation

## 2018-09-03 DIAGNOSIS — R5383 Other fatigue: Secondary | ICD-10-CM | POA: Diagnosis not present

## 2018-09-03 DIAGNOSIS — Z6841 Body Mass Index (BMI) 40.0 and over, adult: Secondary | ICD-10-CM | POA: Insufficient documentation

## 2018-09-03 LAB — NOVEL CORONAVIRUS, NAA (HOSP ORDER, SEND-OUT TO REF LAB; TAT 18-24 HRS): SARS-CoV-2, NAA: NOT DETECTED

## 2018-09-05 ENCOUNTER — Telehealth: Payer: Self-pay | Admitting: Pulmonary Disease

## 2018-09-05 DIAGNOSIS — G4733 Obstructive sleep apnea (adult) (pediatric): Secondary | ICD-10-CM

## 2018-09-05 NOTE — Telephone Encounter (Signed)
Sleep study result  Date of study 09/03/2018  Impression: Severe obstructive sleep apnea  Recommendation:  DME referral  CPAP therapy on 9 cm H2O with a Medium size Fisher&Paykel Full Face Mask F&P Vitera (new) mask and heated humidification  Encourage aggressive weight loss measures  Follow-up in the office 4 to 6 weeks following initiation of treatment

## 2018-09-05 NOTE — Telephone Encounter (Signed)
Route order for signature to S. Elie Confer, NP (app of the day) due to Dr. Ander Slade on vacation.   Judson Roch could you please sign the cpap order. Thank you.

## 2018-09-05 NOTE — Telephone Encounter (Signed)
Attempted to call re: sleep study results.  No answer.  Left voicemail with number to call us.

## 2018-09-05 NOTE — Telephone Encounter (Signed)
Patient returning call.  [phone]

## 2018-09-05 NOTE — Telephone Encounter (Signed)
Returned call to patient.  Advised patient of sleep study results as per Dr. Judson Roch review and recommendations for weight loss and CPAP therapy.  Patient acknowledged understanding and agreed to proceed with CPAP.  Patient had no further questions but was unable to make late July follow up appointment today due to he was driving and needed to check his schedule at home. Patient states he will call back to schedule follow up cpap compliance appointment.     Advised patient of importance of the follow up due to insurance compliance and for our providers to review how patient is tolerating cpap.  Patient states he will call back this week to schedule appt.    Patient had no preference for DME company.  Nothing further needed.  Will leave encounter open to follow up on appointment.

## 2018-09-05 NOTE — Procedures (Signed)
POLYSOMNOGRAPHY  Last, First: Dillon Ramirez, Dillon Ramirez MRN: [id number] Gender: Male Age (years): 38 Weight (lbs): 295 DOB: [birth year] BMI: 40 Primary Care: No PCP Epworth Score: 8 Referring: Laurin Coder MD Technician: Laren Everts Interpreting: Laurin Coder MD Study Type: Split Night CPAP Ordered Study Type: Split Night CPAP Study date: 09/03/2018 Location: [place] CLINICAL INFORMATION Dillon Ramirez is a 38 year old Male and was referred to the sleep center for evaluation of G47.33 OSA: Adult and Pediatric (327.23). Indications include Fatigue, Morning Headaches, Obesity, OSA, Snoring, Witnessed Apneas.  MEDICATIONS Patient self administered medications include: N/A. Medications administered during study include No sleep medicine administered.  SLEEP STUDY TECHNIQUE The patient underwent an attended overnight level one polysomnography titration to assess the effects of CPAP therapy. The following variables were monitored: EEG (C4-A1, C3-A2, O1-A2, O2-A1), EOG, submental and leg EMG, ECG, oxyhemoglobin saturation by pulse oximetry, thoracic and abdominal respiratory effort belts, nasal/oral airflow by pressure sensor, body position sensor and snoring sensor. CPAP pressure was titrated to eliminate apneas, hypopneas and oxygen desaturation. Hypopneas were scored per AASM definition IB (4% desaturation)  The NPSG portion of the study ended at 12:30:04 AM . The CPAP titration was initiated at 12:35:27 AM AM with the CPAP portion of the study ending at 4:43:55 AM.  TECHNICIAN COMMENTS Comments added by Technician: NONE Comments added by Scorer: N/A SLEEP ARCHITECTURE The recording time for the entire night was 393 minutes. The diagnostic portion was initiated at 10:10:53 PM and terminated at 12:30:04 AM. The time in bed was 139.2 minutes. EEG confirmed total sleep time was 126.3 minutes yielding a sleep efficiency of 90.7%%. Sleep onset after lights out was 5.9 minutes with a REM  latency of 62.0 minutes. The patient spent 6.3%% of the night in stage N1 sleep, 79.0%% in stage N2 sleep, 0.0%% in stage N3 and 14.6% in REM. The Arousal Index was 23.3/hour.  The titration portion was initiated at 12:35:27 AM and terminated at 4:43:55 AM. The time in bed was 248.5 minutes. EEG confirmed total sleep time was 235.5 minutes yielding a sleep efficiency of 94.8%%. Sleep onset after CPAP initiation was 0.3 minutes with a REM latency of 15.0 minutes. The patient spent 5.3%% of the night in stage N1 sleep, 60.9%% in stage N2 sleep, 0.0%% in stage N3 and 33.8% in REM. The Arousal Index was 11.7/hour. RESPIRATORY PARAMETERS During the diagnostic portion, there were a total of 77 respiratory disturbances recorded; 13 apneas ( 10 obstructive, 0 mixed, 3 central), 54 hypopneas and 10 RERAs. The apnea/hypopnea index 31.8 was events/hour and the RDI was 36.6 events/hour. The central sleep apnea index was 1.4 events/hour. The REM AHI was 42.2 /h and NREM AHI was 30.1/h. The REM RDI was 51.9 /h and NREM RDI was 34.0 /h. The supine AHI was 45.1/h, and the non supine AHI was 11.9/h; supine during 60.0%% of sleep. The supine RDI was 51.5/h, and the non supine RDI was 14.26/h. Respiratory disturbances were associated with oxygen desaturation down to a nadir of 77.0 % during sleep. The mean oxygen saturation during the study was 91.9%. The cumulative time under 88% oxygen saturation was 5.1 minutes.  During the titration portion, the apnea/hypopnea index (AHI) was 6.6 events/hour and the RDI was 8.4 events/hour. The central sleep apnea index was events/hour. The most appropriate setting of CPAP was IPAP/EPAP 9/9 cm H2O. At this setting, the sleep efficiency was 96% and the patient was supine for 31%. The AHI was 3.8 events per hour(with 2 central events).  Oxygen nadir was 92.0. LEG MOVEMENT DATA The periodic limb movement index was 0.0/hour with an associated arousal index of /hour. CARDIAC DATA The  underlying cardiac rhythm was most consistent with sinus rhythm. Mean heart rate was 86.1 during diagnostic portion and 83.8 during titration portion of study. Additional rhythm abnormalities include PVCs.   IMPRESSIONS - Severe Obstructive Sleep apnea(OSA) Optimal pressure attained. - Electrocardiographic data showed presence of PVCs. - No Significant Central Sleep Apnea (CSA) - Moderate Oxygen Desaturation - The patient snored with moderate snoring volume. - EEG did not show alpha intrusion. - No significant periodic leg movements(PLMs) during sleep, no significant associated arousals. - Normal sleep efficiency, normal primary sleep latency, short REM sleep latency and no slow wave latency.   DIAGNOSIS - Obstructive Sleep Apnea (327.23 [G47.33 ICD-10])   RECOMMENDATIONS - Trial of CPAP therapy on 9 cm H2O with a Medium size Fisher&Paykel Full Face Mask F&P Vitera (new) mask and heated humidification. - Avoid alcohol, sedatives and other CNS depressants that may worsen sleep apnea and disrupt normal sleep architecture. - Sleep hygiene should be reviewed to assess factors that may improve sleep quality. - Weight management and regular exercise should be initiated or continued. - Return to Sleep Center for re-evaluation after4-6 weeks of therapy.  [Electronically signed] 09/05/2018 07:57 AM  Virl DiamondAdewale Safiatou Islam MD NPI: [id number]

## 2018-12-13 ENCOUNTER — Ambulatory Visit
Admission: EM | Admit: 2018-12-13 | Discharge: 2018-12-13 | Disposition: A | Discharge status: Home / Self Care | Payer: No Typology Code available for payment source | Attending: Physician Assistant | Admitting: Physician Assistant

## 2018-12-13 DIAGNOSIS — K219 Gastro-esophageal reflux disease without esophagitis: Secondary | ICD-10-CM | POA: Diagnosis not present

## 2018-12-13 MED ORDER — ALUM & MAG HYDROXIDE-SIMETH 200-200-20 MG/5ML PO SUSP
30.0000 mL | Freq: Once | ORAL | Status: AC
  Administered 2018-12-13: 17:00:00 30 mL via ORAL

## 2018-12-13 MED ORDER — ESOMEPRAZOLE MAGNESIUM 20 MG PO CPDR: 20.0000 mg | DELAYED_RELEASE_CAPSULE | Freq: Every day | ORAL | 0 refills | Status: AC

## 2018-12-13 NOTE — Discharge Instructions (Addendum)
No alarming signs on exam. Your EKG is normal today. Start esomeprazole as directed. You can supplement with pepcid as needed. Bland diet, advance as tolerated. Keep hydrated, urine should be clear to pale yellow in color. As discussed, given family history, follow up with cardiology for baseline heart exam. If experiencing worsening chest pain, with shortness of breath, nausea/vomiting, sweating, go to the ED for further evaluation needed.

## 2018-12-13 NOTE — ED Provider Notes (Signed)
[facility]    CSN: [id number] Arrival date & time: 12/13/18  1638      History   Chief Complaint Chief Complaint  Patient presents with  . Chest Pain    HPI Dillon Ramirez. is a 38 y.o. male.   38 year old male comes in for 1 day history of mid chest pain/epigastric pain.  States woke up with the symptoms, which is common for him for acid reflux.  He had burning sensation, and globus sensation at the time.  States he was unable to fall asleep since, and had some relief after vomiting later in the morning.  Patient has improved, but not completely subsided.  He denies shortness of breath, nausea, diaphoresis, weakness, dizziness.  He denies exertional chest pain, exertional fatigue, exertional shortness of breath. Denies orthopnea, leg swelling.  He has significant family history of heart disease, father with first MI at 38 years of age.  Patient denies personal history of heart disease, hypertension, diabetes.  Denies history of heart evaluation.      Past Medical History:  Diagnosis Date  . GERD (gastroesophageal reflux disease)     Patient Active Problem List   Diagnosis Date Noted  . OSA (obstructive sleep apnea) 09/03/2018    History reviewed. No pertinent surgical history.     Home Medications    Prior to Admission medications   Medication Sig Start Date End Date Taking? Authorizing Provider  esomeprazole (NEXIUM) 20 MG capsule Take 1 capsule (20 mg total) by mouth daily. 12/13/18   Belinda Fisher, PA-C    Family History Family History  Problem Relation Age of Onset  . Heart attack Mother   . Heart attack Father   . Heart attack Maternal Grandmother   . Cancer Maternal Grandmother   . Heart attack Maternal Grandfather   . Cancer Maternal Grandfather   . Heart attack Paternal Grandmother   . Cancer Paternal Grandmother   . Heart attack Paternal Grandfather   . Cancer Paternal Grandfather     Social History Social History   Tobacco Use  .  Smoking status: Never Smoker  . Smokeless tobacco: Never Used  Substance Use Topics  . Alcohol use: Yes    Comment: OCCASIONAL  . Drug use: Never     Allergies   Patient has no known allergies.   Review of Systems Review of Systems  Reason unable to perform ROS: See HPI as above.     Physical Exam Triage Vital Signs ED Triage Vitals [12/13/18 1650]  Enc Vitals Group     BP (!) 151/92     Pulse Rate 88     Resp 18     Temp 98.2 F (36.8 C)     Temp Source Oral     SpO2 95 %     Weight      Height      Head Circumference      Peak Flow      Pain Score 3     Pain Loc      Pain Edu?      Excl. in GC?    No data found.  Updated Vital Signs BP (!) 151/92 (BP Location: Left Arm)   Pulse 88   Temp 98.2 F (36.8 C) (Oral)   Resp 18   SpO2 95%   Physical Exam Constitutional:      General: He is not in acute distress.    Appearance: He is well-developed. He is not ill-appearing, toxic-appearing or diaphoretic.  HENT:     Head: Normocephalic and atraumatic.  Cardiovascular:     Rate and Rhythm: Normal rate and regular rhythm.     Heart sounds: Normal heart sounds. No murmur. No friction rub. No gallop.   Pulmonary:     Effort: Pulmonary effort is normal.     Breath sounds: Normal breath sounds. No wheezing or rales.  Chest:     Chest wall: No tenderness.  Abdominal:     General: Bowel sounds are normal.     Palpations: Abdomen is soft.     Tenderness: There is abdominal tenderness in the epigastric area. There is no right CVA tenderness, left CVA tenderness, guarding or rebound.  Musculoskeletal:     Right lower leg: He exhibits no tenderness. No edema.     Left lower leg: He exhibits no tenderness. No edema.  Skin:    General: Skin is warm and dry.  Neurological:     Mental Status: He is alert and oriented to person, place, and time.  Psychiatric:        Behavior: Behavior normal.        Judgment: Judgment normal.      UC Treatments / Results   Labs (all labs ordered are listed, but only abnormal results are displayed) Labs Reviewed - No data to display  EKG   Radiology No results found.  Procedures Procedures (including critical care time)  Medications Ordered in UC Medications  alum & mag hydroxide-simeth (MAALOX/MYLANTA) 200-200-20 MG/5ML suspension 30 mL (30 mLs Oral Given 12/13/18 1722)    Initial Impression / Assessment and Plan / UC Course  I have reviewed the triage vital signs and the nursing notes.  Pertinent labs & imaging results that were available during my care of the patient were reviewed by me and considered in my medical decision making (see chart for details).     EKG normal sinus rhythm, 76 bpm, no ST changes, no prior EKG for comparison. Patient with resolution of pain after GI cocktail.  Discussed symptoms most likely due to GERD.  Start Nexium as directed.  Can supplement with Pepcid as needed.  Bland diet, advance as tolerated.  Given patient's significant history of heart disease, will have him follow-up with cardiology for baseline heart exam.  Strict return precautions given.  Patient expresses understanding and agrees to plan.  Final Clinical Impressions(s) / UC Diagnoses   Final diagnoses:  Gastroesophageal reflux disease without esophagitis   ED Prescriptions    Medication Sig Dispense Auth. Provider   esomeprazole (NEXIUM) 20 MG capsule Take 1 capsule (20 mg total) by mouth daily. 14 capsule Ok Edwards, PA-C     PDMP not reviewed this encounter.   Ok Edwards, PA-C 12/13/18 1754

## 2018-12-13 NOTE — ED Triage Notes (Signed)
Pt c/o center epigastric/chest pain since 0230 am. States vomit x1 around 10am with some relief. States pain is still there.

## 2018-12-14 ENCOUNTER — Telehealth: Payer: Self-pay | Admitting: Emergency Medicine

## 2018-12-14 NOTE — Telephone Encounter (Signed)
Checked in on patient, discussed medications, and encouraged return call with any continuing questions or concerns.    

## 2021-02-18 ENCOUNTER — Emergency Department (HOSPITAL_BASED_OUTPATIENT_CLINIC_OR_DEPARTMENT_OTHER): Payer: No Typology Code available for payment source

## 2021-02-18 ENCOUNTER — Emergency Department (HOSPITAL_BASED_OUTPATIENT_CLINIC_OR_DEPARTMENT_OTHER)
Admission: EM | Admit: 2021-02-18 | Discharge: 2021-02-18 | Disposition: A | Discharge status: Home / Self Care | Payer: No Typology Code available for payment source | Attending: Emergency Medicine | Admitting: Emergency Medicine

## 2021-02-18 ENCOUNTER — Encounter (HOSPITAL_BASED_OUTPATIENT_CLINIC_OR_DEPARTMENT_OTHER): Payer: Self-pay | Admitting: Emergency Medicine

## 2021-02-18 ENCOUNTER — Other Ambulatory Visit: Payer: Self-pay

## 2021-02-18 DIAGNOSIS — R1011 Right upper quadrant pain: Secondary | ICD-10-CM | POA: Diagnosis present

## 2021-02-18 DIAGNOSIS — N201 Calculus of ureter: Secondary | ICD-10-CM | POA: Diagnosis not present

## 2021-02-18 DIAGNOSIS — K3580 Unspecified acute appendicitis: Secondary | ICD-10-CM | POA: Insufficient documentation

## 2021-02-18 DIAGNOSIS — K219 Gastro-esophageal reflux disease without esophagitis: Secondary | ICD-10-CM | POA: Insufficient documentation

## 2021-02-18 LAB — CBC
HCT: 42.6 % (ref 39.0–52.0)
Hemoglobin: 13.8 g/dL (ref 13.0–17.0)
MCH: 25.1 pg — ABNORMAL LOW (ref 26.0–34.0)
MCHC: 32.4 g/dL (ref 30.0–36.0)
MCV: 77.6 fL — ABNORMAL LOW (ref 80.0–100.0)
Platelets: 277 10*3/uL (ref 150–400)
RBC: 5.49 MIL/uL (ref 4.22–5.81)
RDW: 15.9 % — ABNORMAL HIGH (ref 11.5–15.5)
WBC: 9.9 10*3/uL (ref 4.0–10.5)
nRBC: 0 % (ref 0.0–0.2)

## 2021-02-18 LAB — BASIC METABOLIC PANEL
Anion gap: 13 (ref 5–15)
BUN: 12 mg/dL (ref 6–20)
CO2: 23 mmol/L (ref 22–32)
Calcium: 10.2 mg/dL (ref 8.9–10.3)
Chloride: 105 mmol/L (ref 98–111)
Creatinine, Ser: 0.87 mg/dL (ref 0.61–1.24)
GFR, Estimated: >60 mL/min (ref >60)
Glucose, Bld: 122 mg/dL — ABNORMAL HIGH (ref 70–99)
Potassium: 3.5 mmol/L (ref 3.5–5.1)
Sodium: 141 mmol/L (ref 135–145)

## 2021-02-18 MED ORDER — HYDROCODONE-ACETAMINOPHEN 5-325 MG PO TABS
1.0000 | ORAL_TABLET | Freq: Four times a day (QID) | ORAL | 0 refills | Status: AC | PRN
Start: 2021-02-18 — End: 2021-02-21

## 2021-02-18 MED ORDER — AMOXICILLIN-POT CLAVULANATE 875-125 MG PO TABS: 1.0000 | ORAL_TABLET | Freq: Two times a day (BID) | ORAL | 0 refills | Status: AC

## 2021-02-18 MED ORDER — KETOROLAC TROMETHAMINE 15 MG/ML IJ SOLN
45.0000 mg | Freq: Once | INTRAMUSCULAR | Status: AC
  Administered 2021-02-18: 45 mg via INTRAMUSCULAR
  Filled 2021-02-18: qty 3

## 2021-02-18 MED ORDER — MORPHINE SULFATE (PF) 4 MG/ML IV SOLN
4.0000 mg | Freq: Once | INTRAVENOUS | Status: AC
  Administered 2021-02-18: 4 mg via INTRAVENOUS
  Filled 2021-02-18: qty 1

## 2021-02-18 MED ORDER — KETOROLAC TROMETHAMINE 15 MG/ML IJ SOLN
15.0000 mg | Freq: Once | INTRAMUSCULAR | Status: AC
  Administered 2021-02-18: 15 mg via INTRAVENOUS
  Filled 2021-02-18: qty 1

## 2021-02-18 MED ORDER — ONDANSETRON HCL 4 MG PO TABS: 4.0000 mg | ORAL_TABLET | Freq: Four times a day (QID) | ORAL | 0 refills | Status: AC

## 2021-02-18 MED ORDER — ONDANSETRON HCL 4 MG/2ML IJ SOLN
4.0000 mg | Freq: Once | INTRAMUSCULAR | Status: AC
  Administered 2021-02-18: 4 mg via INTRAVENOUS
  Filled 2021-02-18: qty 2

## 2021-02-18 MED ORDER — TAMSULOSIN HCL 0.4 MG PO CAPS: 0.4000 mg | ORAL_CAPSULE | Freq: Every day | ORAL | 0 refills | Status: AC

## 2021-02-18 NOTE — Discharge Instructions (Addendum)
You may follow-up with the urologist attached to these discharge papers if needed for continued treatment of your kidney stone.  Information about both appendicitis and kidney stones attached to your discharge papers.  I have sent the following medications to the pharmacy: Augmentin: appendicitis antibiotic Tamsulosin: Kidney stone Vicodin: Pain medication with Tylenol [place], do not take Tylenol simultaneously Zofran: Medication for nausea

## 2021-02-18 NOTE — ED Provider Notes (Signed)
[facility] EMERGENCY DEPT Provider Note   CSN: [id number] Arrival date & time: 02/18/21  1740     History Chief Complaint  Patient presents with   Flank Pain    Dillon Telford. is a 40 y.o. male with a past medical history of acid reflux presenting today with complaint of right flank pain, nausea and vomiting that began today.  He reports that over the past few days he has felt some discomfort in his back however continued to work his [employer] job.  Today his pain worsened and he had to leave work.  He went home and attempted to have a bowel movement, however he was unable to.  Denies any recent constipation or diarrhea.  No fevers or chills.  Some nausea and lowered appetite over the past few days.  Emesis started while in route to the hospital.   Past Medical History:  Diagnosis Date   GERD (gastroesophageal reflux disease)     Patient Active Problem List   Diagnosis Date Noted   OSA (obstructive sleep apnea) 09/03/2018    History reviewed. No pertinent surgical history.     Family History  Problem Relation Age of Onset   Heart attack Mother    Heart attack Father    Heart attack Maternal Grandmother    Cancer Maternal Grandmother    Heart attack Maternal Grandfather    Cancer Maternal Grandfather    Heart attack Paternal Grandmother    Cancer Paternal Grandmother    Heart attack Paternal Grandfather    Cancer Paternal Grandfather     Social History   Tobacco Use   Smoking status: Never   Smokeless tobacco: Never  Substance Use Topics   Alcohol use: Yes    Comment: OCCASIONAL   Drug use: Never    Home Medications Prior to Admission medications   Medication Sig Start Date End Date Taking? Authorizing Provider  esomeprazole (NEXIUM) 20 MG capsule Take 1 capsule (20 mg total) by mouth daily. 12/13/18   Belinda Fisher, PA-C    Allergies    Patient has no known allergies.  Review of Systems   Review of Systems  Constitutional:  Negative for chills  and fever.  Respiratory:  Negative for shortness of breath.   Cardiovascular:  Negative for chest pain.  Gastrointestinal:  Positive for nausea and vomiting. Negative for constipation and diarrhea.  Genitourinary:  Positive for difficulty urinating and flank pain. Negative for dysuria.  Musculoskeletal:  Positive for back pain.  Neurological:  Negative for syncope.   Physical Exam Updated Vital Signs BP (!) 144/84   Pulse 70   Temp 97.8 F (36.6 C)   Resp 18   Ht 6' (1.829 m)   Wt 112.9 kg   SpO2 99%   BMI 33.77 kg/m   Physical Exam Vitals and nursing note reviewed.  Constitutional:      General: He is not in acute distress.    Appearance: Normal appearance. He is not ill-appearing.  HENT:     Head: Normocephalic and atraumatic.  Eyes:     General: No scleral icterus.    Conjunctiva/sclera: Conjunctivae normal.  Cardiovascular:     Rate and Rhythm: Normal rate and regular rhythm.  Pulmonary:     Effort: Pulmonary effort is normal. No respiratory distress.  Abdominal:     Tenderness: There is abdominal tenderness (diffusely, worse in RUQ and RLQ). There is right CVA tenderness. There is no left CVA tenderness.  Skin:    General: Skin is  warm.     Findings: No rash.  Neurological:     Mental Status: He is alert.  Psychiatric:        Mood and Affect: Mood normal.    ED Results / Procedures / Treatments   Labs (all labs ordered are listed, but only abnormal results are displayed) Labs Reviewed  BASIC METABOLIC PANEL - Abnormal; Notable for the following components:      Result Value   Glucose, Bld 122 (*)    All other components within normal limits  CBC - Abnormal; Notable for the following components:   MCV 77.6 (*)    MCH 25.1 (*)    RDW 15.9 (*)    All other components within normal limits  URINALYSIS, ROUTINE W REFLEX MICROSCOPIC    EKG None  Radiology CT Renal Stone Study  Result Date: 02/18/2021 CLINICAL DATA:  Flank pain. EXAM: CT ABDOMEN AND  PELVIS WITHOUT CONTRAST TECHNIQUE: Multidetector CT imaging of the abdomen and pelvis was performed following the standard protocol without IV contrast. COMPARISON:  None. FINDINGS: Lower chest: No acute abnormality. Hepatobiliary: No focal liver abnormality is seen. No gallstones, gallbladder wall thickening, or biliary dilatation. Pancreas: Unremarkable. No pancreatic ductal dilatation or surrounding inflammatory changes. Spleen: Normal in size without focal abnormality. Adrenals/Urinary Tract: Adrenal glands are unremarkable. Kidneys are normal in size without focal lesions. A 2 mm obstructing renal stone is seen at the right UVJ (axial CT image 76, CT series 2), with mild right-sided hydronephrosis and hydroureter. Bladder is unremarkable. Stomach/Bowel: There is a small to moderate sized hiatal hernia. Surgical sutures are seen within the adjacent portion of the gastric region. Moderate severity diffuse appendiceal wall thickening is seen (axial CT images 58 through 66, CT series 2) with a mild amount contrast noted throughout the appendiceal lumen. No evidence of bowel wall thickening, distention, or inflammatory changes. Vascular/Lymphatic: Mild aortic atherosclerosis. No enlarged abdominal or pelvic lymph nodes. Reproductive: Prostate is unremarkable. Other: No abdominal wall hernia or abnormality. No abdominopelvic ascites. Musculoskeletal: No acute or significant osseous findings. IMPRESSION: 1. 2 mm obstructing renal stone at the right UVJ. 2. Additional findings which may be seen in early appendicitis. Correlation with physical examination and clinical presentation is recommended. 3. Small to moderate-sized hiatal hernia with adjacent postoperative changes. 4. Aortic atherosclerosis. Aortic Atherosclerosis (ICD10-I70.0). Electronically Signed   By: Aram Candela M.D.   On: 02/18/2021 19:12    Procedures Procedures   Medications Ordered in ED Medications  ketorolac (TORADOL) 15 MG/ML injection  15 mg (15 mg Intravenous Given 02/18/21 1834)  ondansetron (ZOFRAN) injection 4 mg (4 mg Intravenous Given 02/18/21 1833)  morphine 4 MG/ML injection 4 mg (4 mg Intravenous Given 02/18/21 2009)    ED Course  I have reviewed the triage vital signs and the nursing notes.  Pertinent labs & imaging results that were available during my care of the patient were reviewed by me and considered in my medical decision making (see chart for details).    MDM Rules/Calculators/A&P Upon my first assessment patient was doubled over the trash can vomiting and complaining of 10 out of 10 pain.  Toradol and Zofran given.  Kidney stone suspected, CT scan and lab work ordered.  Patient CT scan revealing of a 2 mm UVJ obstructing stone.  Urology, MD Benancio Deeds, was consulted who recommended Flomax, Percocet and a Toradol injection prior to departure.  I will give him the office to follow-up with if needed.  CT scan also called for an appendicitis.  I personally spoke with the radiologist, Dr. Londell Moh, who recommended potential treatment of both.  Surgery was consulted, MD Stechshulte, who stated that the patient may be treated with outpatient antibiotics for a potential appendicitis.  No urgent surgery required.  These plans were discussed with the patient and he is agreeable to the plan.  Lab work benign.  Unable to get urine sample at this time.  To be discharged with Flomax, Augmentin, Zofran and Vicodin.  Follow-up offices and work note attached to the discharge papers.  Final Clinical Impression(s) / ED Diagnoses Final diagnoses:  Calculus of ureterovesical junction (UVJ)  Acute appendicitis, unspecified acute appendicitis type    Rx / DC Orders Results and diagnoses were explained to the patient. Return precautions discussed in full. Patient had no additional questions and expressed complete understanding.   Saddie Benders, PA-C 02/18/21 2044    Sloan Leiter, DO 02/18/21 2336

## 2021-02-18 NOTE — ED Triage Notes (Signed)
Pt arrives to ED with c/o of bilateral flank pain x1 week. Pt reports over the past week his flank pain has been dull into today it has become severe. He states that today he developed bilateral groin pain. He does report that he feels like he is unable to urinate and has urinated once this morning. Flank pain is described as sharp. Emesis starting to day x1.

## 2021-07-05 ENCOUNTER — Encounter (HOSPITAL_BASED_OUTPATIENT_CLINIC_OR_DEPARTMENT_OTHER): Payer: Self-pay

## 2021-07-05 ENCOUNTER — Other Ambulatory Visit: Payer: Self-pay

## 2021-07-05 ENCOUNTER — Emergency Department (HOSPITAL_BASED_OUTPATIENT_CLINIC_OR_DEPARTMENT_OTHER)
Admission: EM | Admit: 2021-07-05 | Discharge: 2021-07-05 | Disposition: A | Discharge status: Home / Self Care | Payer: No Typology Code available for payment source | Attending: Emergency Medicine | Admitting: Emergency Medicine

## 2021-07-05 ENCOUNTER — Emergency Department (HOSPITAL_BASED_OUTPATIENT_CLINIC_OR_DEPARTMENT_OTHER): Payer: No Typology Code available for payment source

## 2021-07-05 DIAGNOSIS — R310 Gross hematuria: Secondary | ICD-10-CM

## 2021-07-05 DIAGNOSIS — R109 Unspecified abdominal pain: Secondary | ICD-10-CM

## 2021-07-05 DIAGNOSIS — Z87442 Personal history of urinary calculi: Secondary | ICD-10-CM | POA: Diagnosis not present

## 2021-07-05 LAB — COMPREHENSIVE METABOLIC PANEL
ALT: 11 U/L (ref 0–44)
AST: 10 U/L — ABNORMAL LOW (ref 15–41)
Albumin: 4.4 g/dL (ref 3.5–5.0)
Alkaline Phosphatase: 57 U/L (ref 38–126)
Anion gap: 8 (ref 5–15)
BUN: 12 mg/dL (ref 6–20)
CO2: 27 mmol/L (ref 22–32)
Calcium: 9.3 mg/dL (ref 8.9–10.3)
Chloride: 104 mmol/L (ref 98–111)
Creatinine, Ser: 0.78 mg/dL (ref 0.61–1.24)
GFR, Estimated: >60 mL/min (ref >60)
Glucose, Bld: 85 mg/dL (ref 70–99)
Potassium: 3.8 mmol/L (ref 3.5–5.1)
Sodium: 139 mmol/L (ref 135–145)
Total Bilirubin: 0.7 mg/dL (ref 0.3–1.2)
Total Protein: 7.2 g/dL (ref 6.5–8.1)

## 2021-07-05 LAB — CBC WITH DIFFERENTIAL/PLATELET
Abs Immature Granulocytes: 0.01 10*3/uL (ref 0.00–0.07)
Basophils Absolute: 0 10*3/uL (ref 0.0–0.1)
Basophils Relative: 0 %
Eosinophils Absolute: 0.1 10*3/uL (ref 0.0–0.5)
Eosinophils Relative: 1 %
HCT: 42.4 % (ref 39.0–52.0)
Hemoglobin: 13.7 g/dL (ref 13.0–17.0)
Immature Granulocytes: 0 %
Lymphocytes Relative: 40 %
Lymphs Abs: 2.6 10*3/uL (ref 0.7–4.0)
MCH: 25.3 pg — ABNORMAL LOW (ref 26.0–34.0)
MCHC: 32.3 g/dL (ref 30.0–36.0)
MCV: 78.4 fL — ABNORMAL LOW (ref 80.0–100.0)
Monocytes Absolute: 0.4 10*3/uL (ref 0.1–1.0)
Monocytes Relative: 7 %
Neutro Abs: 3.4 10*3/uL (ref 1.7–7.7)
Neutrophils Relative %: 52 %
Platelets: 274 10*3/uL (ref 150–400)
RBC: 5.41 MIL/uL (ref 4.22–5.81)
RDW: 14.7 % (ref 11.5–15.5)
WBC: 6.5 10*3/uL (ref 4.0–10.5)
nRBC: 0 % (ref 0.0–0.2)

## 2021-07-05 LAB — URINALYSIS, ROUTINE W REFLEX MICROSCOPIC
Bilirubin Urine: NEGATIVE
Glucose, UA: NEGATIVE mg/dL
Nitrite: NEGATIVE
Protein, ur: 100 mg/dL — AB
RBC / HPF: >50 RBC/hpf — ABNORMAL HIGH (ref 0–5)
Specific Gravity, Urine: 1.023 (ref 1.005–1.030)
pH: 5.5 (ref 5.0–8.0)

## 2021-07-05 NOTE — ED Provider Notes (Signed)
?[facility] EMER[facility] DEPT ?Provider Note ? ? ?CSN: [id number] ?Arrival date & time: 07/05/21  1520 ? ?  ? ?History ? ?Chief Complaint  ?Patient presents with  ? Flank Pain  ? ? ?Dillon Ramirez. is a 41 y.o. male. ? ?Patient with no pertinent past medical history presents today with complaints of bilateral flank pain and hematuria. States that same has been ongoing for the past 2-3 days and has been intermittent in nature without a discernable trigger. He states that today he noticed hematuria without visible clots which was concerning for him and prompted his visit to the ER today. States that he has a history of kidney stones in the past and this feels similarly. Denies fevers, chills, or abdominal pain. ? ?The history is provided by the patient. No language interpreter was used.  ?Flank Pain ? ? ?  ? ?Home Medications ?Prior to Admission medications   ?Medication Sig Start Date End Date Taking? Authorizing Provider  ?esomeprazole (NEXIUM) 20 MG capsule Take 1 capsule (20 mg total) by mouth daily. 12/13/18   Ok Edwards, PA-C  ?   ? ?Allergies    ?Patient has no known allergies.   ? ?Review of Systems   ?Review of Systems  ?Genitourinary:  Positive for flank pain.  ?All other systems reviewed and are negative. ? ?Physical Exam ?Updated Vital Signs ?BP 121/72 (BP Location: Left Arm)   Pulse 65   Temp 98 ?F (36.7 ?C)   Resp 18   Ht 6' (1.829 m)   Wt 112.9 kg   SpO2 96%   BMI 33.76 kg/m?  ?Physical Exam ?Vitals and nursing note reviewed.  ?Constitutional:   ?   General: He is not in acute distress. ?   Appearance: Normal appearance. He is normal weight. He is not ill-appearing, toxic-appearing or diaphoretic.  ?HENT:  ?   Head: Normocephalic and atraumatic.  ?Cardiovascular:  ?   Rate and Rhythm: Normal rate and regular rhythm.  ?   Heart sounds: Normal heart sounds.  ?Pulmonary:  ?   Effort: Pulmonary effort is normal. No respiratory distress.  ?   Breath sounds: Normal breath sounds.  ?Abdominal:   ?   General: Abdomen is flat.  ?   Palpations: Abdomen is soft.  ?   Tenderness: There is no right CVA tenderness or left CVA tenderness.  ?Musculoskeletal:     ?   General: Normal range of motion.  ?   Cervical back: Normal range of motion.  ?Skin: ?   General: Skin is warm and dry.  ?Neurological:  ?   General: No focal deficit present.  ?   Mental Status: He is alert.  ?Psychiatric:     ?   Mood and Affect: Mood normal.     ?   Behavior: Behavior normal.  ? ? ?ED Results / Procedures / Treatments   ?Labs ?(all labs ordered are listed, but only abnormal results are displayed) ?Labs Reviewed  ?COMPREHENSIVE METABOLIC PANEL - Abnormal; Notable for the following components:  ?    Result Value  ? AST 10 (*)   ? All other components within normal limits  ?CBC WITH DIFFERENTIAL/PLATELET - Abnormal; Notable for the following components:  ? MCV 78.4 (*)   ? MCH 25.3 (*)   ? All other components within normal limits  ?URINALYSIS, ROUTINE W REFLEX MICROSCOPIC - Abnormal; Notable for the following components:  ? Color, Urine BROWN (*)   ? APPearance CLOUDY (*)   ? Hgb  urine dipstick LARGE (*)   ? Ketones, ur TRACE (*)   ? Protein, ur 100 (*)   ? Leukocytes,Ua SMALL (*)   ? RBC / HPF >50 (*)   ? Bacteria, UA FEW (*)   ? All other components within normal limits  ? ? ?EKG ?None ? ?Radiology ?CT Renal Stone Study ? ?Result Date: 07/05/2021 ?CLINICAL DATA:  Bilateral flank pain.  Hematuria. EXAM: CT ABDOMEN AND PELVIS WITHOUT CONTRAST TECHNIQUE: Multidetector CT imaging of the abdomen and pelvis was performed following the standard protocol without IV contrast. RADIATION DOSE REDUCTION: This exam was performed according to the departmental dose-optimization program which includes automated exposure control, adjustment of the mA and/or kV according to patient size and/or use of iterative reconstruction technique. COMPARISON:  CT renal stone 02/18/2021. FINDINGS: Lower chest: No acute abnormality. Hepatobiliary: No focal liver  abnormality is seen. No gallstones, gallbladder wall thickening, or biliary dilatation. Pancreas: Unremarkable. No pancreatic ductal dilatation or surrounding inflammatory changes. Spleen: Normal in size without focal abnormality. Adrenals/Urinary Tract: Adrenal glands are unremarkable. Kidneys are normal, without renal calculi, focal lesion, or hydronephrosis. Bladder is unremarkable. Stomach/Bowel: There are postsurgical changes in the stomach. There is a small hiatal hernia. Appendix appears normal. No evidence of bowel wall thickening, distention, or inflammatory changes. Vascular/Lymphatic: Aortic atherosclerosis. No enlarged abdominal or pelvic lymph nodes. Reproductive: Prostate is unremarkable. Other: No abdominal wall hernia or abnormality. No abdominopelvic ascites. Musculoskeletal: No acute or significant osseous findings. IMPRESSION: 1. No evidence for urinary tract calculus or obstruction. 2. Postsurgical changes in the stomach with small hiatal hernia, similar to prior. 3.  Aortic Atherosclerosis (ICD10-I70.0). Electronically Signed   By: Ronney Asters M.D.   On: 07/05/2021 18:07   ? ?Procedures ?Procedures  ? ? ?Medications Ordered in ED ?Medications - No data to display ? ?ED Course/ Medical Decision Making/ A&P ?  ?                        ?Medical Decision Making ?Amount and/or Complexity of Data Reviewed ?Labs: ordered. ?Radiology: ordered. ? ? ?Patient presents today with hematuria and intermittent flank pain.  He is afebrile, nontoxic-appearing, and in no acute distress with reassuring vital signs.  CT renal stone study without evidence of any acute abnormalities, no kidney stones visualized.  I have personally reviewed this imaging and agree with radiology interpretation.  No acute laboratory findings appreciated.  UA with gross hematuria.  Few leukocytes and bacteria seen as well, however patient denies any dysuria.  Upon my evaluation of patient he denies any pain in the past several hours.   Therefore suspect that patient had a kidney stone which is now passed which is causing the hematuria.  No other emergent concerns at this time.  He is stable for discharge at this time.  Educated on red flag symptoms that would prompt immediate return.  Will give urology referral with a number to call if needed if hematuria continues in the next few days.  Patient is understanding and amenable with plan.  Discharged in stable condition. ? ?Final Clinical Impression(s) / ED Diagnoses ?Final diagnoses:  ?Bilateral flank pain  ?Gross hematuria  ? ? ?Rx / DC Orders ?ED Discharge Orders   ? ? None  ? ?  ?An After Visit Summary was printed and given to the patient. ? ? ?  ?Bud Face, PA-C ?07/05/21 2242 ? ?  ?Lianne Cure, DO ?[id number] ? ?

## 2021-07-05 NOTE — ED Triage Notes (Signed)
Patient here POV from Home. ? ?Endorses Intermittent Bilateral Flank Pain that began 2 days PTA and Patient noted Hematuria this AM. ? ?No Pain with Urination. No Fevers. No NV/D.  ? ?NAD Noted during Triage. A&Ox4. GCS 15. Ambulatory. ?

## 2021-07-05 NOTE — Discharge Instructions (Signed)
As we discussed, your work-up in the ER was reassuring for acute abnormalities.  I suspect that you have already passed the kidney stone that was causing your symptoms and residual blood in your urine.  You should begin to feel better in the next few days.  In the interim please take Tylenol as needed for pain control.  I have given you a referral to urology with a number to call in the next few days if you continue to have blood in your urine. ? ?Return if development of any new or worsening symptoms. ?

## 2021-07-05 NOTE — ED Provider Triage Note (Signed)
Emergency Medicine Provider Triage Evaluation Note ? ?Dillon Ramirez. , a 41 y.o. male  was evaluated in triage.  Pt complains of bilateral flank pain. States that same has been ongoing for the past 2-3 days and has been intermittent in nature without a discernable trigger. He states that today he noticed hematuria without visible clots. States that he has a history of kidney stones in the past and this feels similarly. Denies fevers, chills, or abdominal pain. ? ?Review of Systems  ?Positive:  ?Negative:  ? ?Physical Exam  ?BP 121/86 (BP Location: Right Arm)   Pulse 61   Temp 98 ?F (36.7 ?C)   Resp 16   Ht 6' (1.829 m)   Wt 112.9 kg   SpO2 100%   BMI 33.76 kg/m?  ?Gen:   Awake, no distress   ?Resp:  Normal effort  ?MSK:   Moves extremities without difficulty  ?Other:  No CVA tenderness ? ?Medical Decision Making  ?Medically screening exam initiated at 4:26 PM.  Appropriate orders placed.  Marvens Arlan Organ. was informed that the remainder of the evaluation will be completed by another provider, this initial triage assessment does not replace that evaluation, and the importance of remaining in the ED until their evaluation is complete. ? ? ?  ?Silva Bandy, PA-C ?07/05/21 1628 ? ?

## 2023-08-18 ENCOUNTER — Ambulatory Visit
Admission: EM | Admit: 2023-08-18 | Discharge: 2023-08-18 | Disposition: A | Discharge status: Home / Self Care | Payer: Self-pay | Attending: Emergency Medicine | Admitting: Emergency Medicine

## 2023-08-18 DIAGNOSIS — B349 Viral infection, unspecified: Secondary | ICD-10-CM

## 2023-08-18 LAB — POC COVID19/FLU A&B COMBO
Covid Antigen, POC: NEGATIVE
Influenza A Antigen, POC: NEGATIVE
Influenza B Antigen, POC: NEGATIVE

## 2023-08-18 MED ORDER — IBUPROFEN 400 MG PO TABS
400.0000 mg | ORAL_TABLET | Freq: Once | ORAL | Status: AC
  Administered 2023-08-18: 400 mg via ORAL

## 2023-08-18 NOTE — ED Triage Notes (Signed)
 Patient to Urgent Care with complaints of fevers (102) / body aches/ productive cough/ chills/ sweats/ nasal congestion.   Symptoms x3 days.  Meds: Mucinex/ tylenol  sinus/ claritin.

## 2023-08-18 NOTE — ED Provider Notes (Signed)
 Dillon Ramirez    CSN: [id number] Arrival date & time: 08/18/23  1820      History   Chief Complaint Chief Complaint  Patient presents with   Fever    HPI Dillon Ramirez. is a 43 y.o. male.  Patient presents with 3-day history of fever, chills, body aches, congestion, cough.  He has been treating his symptoms with Tylenol  sinus medication, Mucinex, Claritin.  No shortness of breath, vomiting, diarrhea.  The history is provided by the patient and medical records.    Past Medical History:  Diagnosis Date   GERD (gastroesophageal reflux disease)     Patient Active Problem List   Diagnosis Date Noted   OSA (obstructive sleep apnea) 09/03/2018    Past Surgical History:  Procedure Laterality Date   GASTRIC BYPASS         Home Medications    Prior to Admission medications   Medication Sig Start Date End Date Taking? Authorizing Provider  esomeprazole  (NEXIUM ) 20 MG capsule Take 1 capsule (20 mg total) by mouth daily. 12/13/18   Alfrieda Antes, PA-C    Family History Family History  Problem Relation Age of Onset   Heart attack Mother    Heart attack Father    Heart attack Maternal Grandmother    Cancer Maternal Grandmother    Heart attack Maternal Grandfather    Cancer Maternal Grandfather    Heart attack Paternal Grandmother    Cancer Paternal Grandmother    Heart attack Paternal Grandfather    Cancer Paternal Grandfather     Social History Social History   Tobacco Use   Smoking status: Never   Smokeless tobacco: Never  Substance Use Topics   Alcohol use: Yes    Comment: OCCASIONAL   Drug use: Never     Allergies   Patient has no known allergies.   Review of Systems Review of Systems  Constitutional:  Positive for chills and fever.  HENT:  Positive for congestion, postnasal drip and rhinorrhea. Negative for ear pain and sore throat.   Respiratory:  Positive for cough. Negative for shortness of breath.      Physical Exam Triage Vital  Signs ED Triage Vitals  Encounter Vitals Group     BP 08/18/23 1845 107/70     Systolic BP Percentile --      Diastolic BP Percentile --      Pulse Rate 08/18/23 1845 83     Resp 08/18/23 1845 18     Temp 08/18/23 1845 (!) 102.6 F (39.2 C)     Temp src --      SpO2 08/18/23 1845 98 %     Weight --      Height --      Head Circumference --      Peak Flow --      Pain Score 08/18/23 1841 8     Pain Loc --      Pain Education --      Exclude from Growth Chart --    No data found.  Updated Vital Signs BP 107/70   Pulse 83   Temp 99 F (37.2 C) (Oral)   Resp 18   SpO2 98%   Visual Acuity Right Eye Distance:   Left Eye Distance:   Bilateral Distance:    Right Eye Near:   Left Eye Near:    Bilateral Near:     Physical Exam Constitutional:      General: He is not in acute distress.  HENT:     Right Ear: Tympanic membrane normal.     Left Ear: Tympanic membrane normal.     Nose: Rhinorrhea present.     Mouth/Throat:     Mouth: Mucous membranes are moist.     Pharynx: Oropharynx is clear.  Cardiovascular:     Rate and Rhythm: Normal rate and regular rhythm.     Heart sounds: Normal heart sounds.  Pulmonary:     Effort: Pulmonary effort is normal. No respiratory distress.     Breath sounds: Normal breath sounds.  Neurological:     Mental Status: He is alert.      UC Treatments / Results  Labs (all labs ordered are listed, but only abnormal results are displayed) Labs Reviewed  POC COVID19/FLU A&B COMBO - Normal    EKG   Radiology No results found.  Procedures Procedures (including critical care time)  Medications Ordered in UC Medications  ibuprofen (ADVIL) tablet 400 mg (400 mg Oral Given 08/18/23 1853)    Initial Impression / Assessment and Plan / UC Course  I have reviewed the triage vital signs and the nursing notes.  Pertinent labs & imaging results that were available during my care of the patient were reviewed by me and considered in my  medical decision making (see chart for details).    Viral illness.  Rapid COVID and flu negative.  Ibuprofen given here for temp of 102.6.  Patient took Tylenol  at 92.  Discussed symptomatic treatment including Tylenol  or ibuprofen as needed for fever or discomfort, plain Mucinex as needed for congestion, rest, hydration.  Instructed patient to follow-up with PCP if not improving.  ED precautions given.  Patient agrees to plan of care.  Final Clinical Impressions(s) / UC Diagnoses   Final diagnoses:  Viral illness     Discharge Instructions      The COVID and flu tests are negative.   Take Tylenol  or ibuprofen as needed for fever or discomfort.  Take plain Mucinex as needed for congestion.  Rest and keep yourself hydrated.    Follow-up with your primary care provider if your symptoms are not improving.       ED Prescriptions   None    PDMP not reviewed this encounter.   Wellington Half, NP 08/18/23 [id number]

## 2023-08-18 NOTE — Discharge Instructions (Addendum)
 The COVID and flu tests are negative.   Take Tylenol or ibuprofen as needed for fever or discomfort.  Take plain Mucinex as needed for congestion.  Rest and keep yourself hydrated.    Follow-up with your primary care provider if your symptoms are not improving.

## 2024-03-23 ENCOUNTER — Ambulatory Visit (HOSPITAL_COMMUNITY)
Admission: EM | Admit: 2024-03-23 | Discharge: 2024-03-23 | Disposition: A | Discharge status: Home / Self Care | Attending: Emergency Medicine | Admitting: Emergency Medicine

## 2024-03-23 ENCOUNTER — Encounter (HOSPITAL_COMMUNITY): Payer: Self-pay | Admitting: *Deleted

## 2024-03-23 DIAGNOSIS — H6993 Unspecified Eustachian tube disorder, bilateral: Secondary | ICD-10-CM | POA: Diagnosis not present

## 2024-03-23 DIAGNOSIS — H9203 Otalgia, bilateral: Secondary | ICD-10-CM

## 2024-03-23 MED ORDER — PREDNISONE 20 MG PO TABS: 40.0000 mg | ORAL_TABLET | Freq: Every day | ORAL | 0 refills | Status: AC

## 2024-03-23 NOTE — ED Provider Notes (Signed)
 " [facility]    CSN: [id number] Arrival date & time: 03/23/24  1701      History   Chief Complaint Chief Complaint  Patient presents with   Otalgia    HPI Dillon Ramirez. is a 44 y.o. male.   Patient presents to clinic with his significant other over concern of bilateral ear pain and pressure.  This has been an ongoing issue for which he sees ENT for in the next week.  Intermittent congestion and rhinorrhea.  Prior to arrival patient did a Nettie pot with faucet water and had a lot of pain on bilateral ears.  This was worse than usual so he decided to come into clinic for evaluation.  He does not take any daily antihistamines or use any nasal spray.  He was prescribed a nasal spray at his last visit but only used this once.  Reports he has never really worked for him.  Has not any fever.  Does have a mild cough, feels as if the cough is from postnasal drip. Endorses sinus pressure.  The history is provided by the patient and medical records.  Otalgia   Past Medical History:  Diagnosis Date   GERD (gastroesophageal reflux disease)     Patient Active Problem List   Diagnosis Date Noted   OSA (obstructive sleep apnea) 09/03/2018    Past Surgical History:  Procedure Laterality Date   GASTRIC BYPASS         Home Medications    Prior to Admission medications  Medication Sig Start Date End Date Taking? Authorizing Provider  predniSONE  (DELTASONE ) 20 MG tablet Take 2 tablets (40 mg total) by mouth daily for 5 days. 03/23/24 03/28/24 Yes Ymani Porcher  N, FNP  esomeprazole  (NEXIUM ) 20 MG capsule Take 1 capsule (20 mg total) by mouth daily. 12/13/18   Babara Greig GAILS, PA-C    Family History Family History  Problem Relation Age of Onset   Heart attack Mother    Heart attack Father    Heart attack Maternal Grandmother    Cancer Maternal Grandmother    Heart attack Maternal Grandfather    Cancer Maternal Grandfather    Heart attack Paternal Grandmother     Cancer Paternal Grandmother    Heart attack Paternal Grandfather    Cancer Paternal Grandfather     Social History Social History[1]   Allergies   Patient has no known allergies.   Review of Systems Review of Systems  Per HPI  Physical Exam Triage Vital Signs ED Triage Vitals  Encounter Vitals Group     BP 03/23/24 1801 124/73     Girls Systolic BP Percentile --      Girls Diastolic BP Percentile --      Boys Systolic BP Percentile --      Boys Diastolic BP Percentile --      Pulse Rate 03/23/24 1801 92     Resp 03/23/24 1801 18     Temp 03/23/24 1801 98.7 F (37.1 C)     Temp Source 03/23/24 1801 Oral     SpO2 03/23/24 1801 98 %     Weight --      Height --      Head Circumference --      Peak Flow --      Pain Score 03/23/24 1800 9     Pain Loc --      Pain Education --      Exclude from Growth Chart --    No  data found.  Updated Vital Signs BP 124/73 (BP Location: Right Arm)   Pulse 92   Temp 98.7 F (37.1 C) (Oral)   Resp 18   SpO2 98%   Visual Acuity Right Eye Distance:   Left Eye Distance:   Bilateral Distance:    Right Eye Near:   Left Eye Near:    Bilateral Near:     Physical Exam Vitals and nursing note reviewed.  Constitutional:      Appearance: Normal appearance.  HENT:     Head: Normocephalic and atraumatic.     Right Ear: A middle ear effusion is present.     Left Ear: A middle ear effusion is present.     Nose: Rhinorrhea present.     Mouth/Throat:     Mouth: Mucous membranes are moist.     Pharynx: Posterior oropharyngeal erythema present.  Eyes:     Conjunctiva/sclera: Conjunctivae normal.  Cardiovascular:     Rate and Rhythm: Normal rate and regular rhythm.     Heart sounds: Normal heart sounds. No murmur heard. Pulmonary:     Effort: Pulmonary effort is normal. No respiratory distress.     Breath sounds: Normal breath sounds. No wheezing.  Skin:    General: Skin is warm and dry.  Neurological:     General: No focal  deficit present.     Mental Status: He is alert.  Psychiatric:        Mood and Affect: Mood normal.        Behavior: Behavior is cooperative.      UC Treatments / Results  Labs (all labs ordered are listed, but only abnormal results are displayed) Labs Reviewed - No data to display  EKG   Radiology No results found.  Procedures Procedures (including critical care time)  Medications Ordered in UC Medications - No data to display  Initial Impression / Assessment and Plan / UC Course  I have reviewed the triage vital signs and the nursing notes.  Pertinent labs & imaging results that were available during my care of the patient were reviewed by me and considered in my medical decision making (see chart for details).  Vitals and triage reviewed, patient is hemodynamically stable.  Lungs vesicular, heart with regular rate and rhythm.  Middle ear effusion present bilaterally, without evidence of otitis media or externa.  Encouraged antihistamine nasal spray, will trial oral steroid as well due to the extreme increase of pressure on both ears.  Suspect eustachian tube dysfunction.  Encourage ENT follow-up.  Plan of care, follow-up care return precautions given, no questions at this time.     Final Clinical Impressions(s) / UC Diagnoses   Final diagnoses:  Otalgia of both ears  Dysfunction of both eustachian tubes     Discharge Instructions      Start the steroids today and then take them daily with breakfast.  This should help with inflammation and pressure on the ears.  You can also use over-the-counter Flonase to help.  Ibuprofen  800 mg can be taken every 8 hours for pain as well.   Follow-up with ENT as scheduled.     ED Prescriptions     Medication Sig Dispense Auth. Provider   predniSONE  (DELTASONE ) 20 MG tablet Take 2 tablets (40 mg total) by mouth daily for 5 days. 10 tablet Dreama, Chaunta Bejarano  N, FNP      PDMP not reviewed this encounter.     [1]   Social History Tobacco Use   Smoking status:  Never   Smokeless tobacco: Never  Vaping Use   Vaping status: Never Used  Substance Use Topics   Alcohol use: Yes    Comment: OCCASIONAL   Drug use: Never     Dreama Lorence SAILOR, FNP 03/23/24 1822  "

## 2024-03-23 NOTE — Discharge Instructions (Addendum)
 Start the steroids today and then take them daily with breakfast.  This should help with inflammation and pressure on the ears.  You can also use over-the-counter Flonase to help.  Ibuprofen  800 mg can be taken every 8 hours for pain as well.   Follow-up with ENT as scheduled.

## 2024-03-23 NOTE — ED Triage Notes (Signed)
 Pt states he has bilateral ear pain and pressure that has been chronic. He has a appt scheduled with ENT but he couldn't wait.
